# Patient Record
Sex: Female | Born: 1980 | Race: Black or African American | Hispanic: No | Marital: Single | Smoking: Former smoker
Health system: Southern US, Community
[De-identification: ages and names within clinical notes are randomized; demographics above are authoritative.]

## PROBLEM LIST (undated history)

## (undated) DIAGNOSIS — R0600 Dyspnea, unspecified: Secondary | ICD-10-CM

## (undated) DIAGNOSIS — O2230 Deep phlebothrombosis in pregnancy, unspecified trimester: Secondary | ICD-10-CM

## (undated) DIAGNOSIS — J342 Deviated nasal septum: Secondary | ICD-10-CM

## (undated) DIAGNOSIS — E282 Polycystic ovarian syndrome: Secondary | ICD-10-CM

## (undated) DIAGNOSIS — I2699 Other pulmonary embolism without acute cor pulmonale: Secondary | ICD-10-CM

## (undated) DIAGNOSIS — G4730 Sleep apnea, unspecified: Secondary | ICD-10-CM

## (undated) DIAGNOSIS — I10 Essential (primary) hypertension: Secondary | ICD-10-CM

## (undated) DIAGNOSIS — I701 Atherosclerosis of renal artery: Secondary | ICD-10-CM

## (undated) DIAGNOSIS — M109 Gout, unspecified: Secondary | ICD-10-CM

## (undated) HISTORY — DX: Polycystic ovarian syndrome: E28.2

## (undated) HISTORY — PX: WISDOM TOOTH EXTRACTION: SHX21

## (undated) HISTORY — DX: Sleep apnea, unspecified: G47.30

## (undated) HISTORY — DX: Essential (primary) hypertension: I10

## (undated) HISTORY — DX: Deviated nasal septum: J34.2

## (undated) HISTORY — DX: Gout, unspecified: M10.9

## (undated) HISTORY — PX: APPENDECTOMY: SHX54

---

## 2018-08-31 DIAGNOSIS — I517 Cardiomegaly: Secondary | ICD-10-CM

## 2019-05-03 DIAGNOSIS — E282 Polycystic ovarian syndrome: Secondary | ICD-10-CM | POA: Insufficient documentation

## 2019-05-07 ENCOUNTER — Ambulatory Visit: Payer: BC Managed Care – PPO | Admitting: Allergy and Immunology

## 2019-05-08 ENCOUNTER — Ambulatory Visit: Payer: BC Managed Care – PPO | Admitting: Allergy

## 2019-05-09 ENCOUNTER — Ambulatory Visit: Payer: BC Managed Care – PPO | Admitting: Allergy and Immunology

## 2019-05-10 ENCOUNTER — Ambulatory Visit (INDEPENDENT_AMBULATORY_CARE_PROVIDER_SITE_OTHER): Payer: BC Managed Care – PPO | Admitting: Allergy and Immunology

## 2019-05-10 ENCOUNTER — Encounter: Payer: Self-pay | Admitting: Allergy and Immunology

## 2019-05-10 ENCOUNTER — Encounter (INDEPENDENT_AMBULATORY_CARE_PROVIDER_SITE_OTHER): Payer: Self-pay

## 2019-05-10 ENCOUNTER — Other Ambulatory Visit: Payer: Self-pay

## 2019-05-10 VITALS — BP 170/90 | HR 72 | Temp 97.2°F | Resp 16 | Ht 65.0 in | Wt 379.0 lb

## 2019-05-10 DIAGNOSIS — K219 Gastro-esophageal reflux disease without esophagitis: Secondary | ICD-10-CM

## 2019-05-10 DIAGNOSIS — G43909 Migraine, unspecified, not intractable, without status migrainosus: Secondary | ICD-10-CM | POA: Diagnosis not present

## 2019-05-10 DIAGNOSIS — J3089 Other allergic rhinitis: Secondary | ICD-10-CM | POA: Diagnosis not present

## 2019-05-10 MED ORDER — BUDESONIDE 32 MCG/ACT NA SUSP: NASAL | 5 refills | Status: DC

## 2019-05-10 MED ORDER — OMEPRAZOLE 40 MG PO CPDR: 40.0000 mg | DELAYED_RELEASE_CAPSULE | ORAL | 5 refills | Status: DC

## 2019-05-10 MED ORDER — CYPROHEPTADINE HCL 4 MG PO TABS: ORAL_TABLET | ORAL | 5 refills | Status: DC

## 2019-05-10 MED ORDER — FAMOTIDINE 40 MG PO TABS: 40.0000 mg | ORAL_TABLET | Freq: Every day | ORAL | 5 refills | Status: DC

## 2019-05-10 NOTE — Patient Instructions (Addendum)
  1.  Allergen avoidance measures?  2.  Treat and prevent inflammation:   A. OTC Budesonide - 1 spray each nostril 1 time per day  3.  Treat and prevent reflux:   A. Omeprazole 40 mg - 1 tablet 1 time per day in AM  B. Famotidine 40 mg - 1 tablet 1 time per day in PM  4. Treat and prevent headaches:   A. Periactin - 1/2 to 1 tablet at bedtime  B. No caffeine or chocolate consumption  5. If needed:   A. Nasal saline  B. OTC antihistamine  6. Obtain COVID vaccine  7. Return to clinic in 4 weeks or earlier if problem

## 2019-05-10 NOTE — Progress Notes (Signed)
[place] [facility] [place] - [place]   Dear FNP Kerby Nora,  Thank you for referring Joan Beard to the [facility] [place] on 05/10/2019.   Below is a summation of this patient's evaluation and recommendations.  Thank you for your referral. I will keep you informed about this patient's response to treatment.   If you have any questions please do not hesitate to contact me.   Sincerely,  Jessica Priest, MD Allergy / Immunology [facility]   ______________________________________________________________________    NEW PATIENT NOTE  Referring Provider: Vivien Rota* Primary Provider: Garrel Ridgel, FNP Date of office visit: 05/10/2019    Subjective:   Chief Complaint:  Joan Beard (DOB: June 15, 1980) is a 39 y.o. female who presents to the clinic on 05/10/2019 with a chief complaint of Sinus Problem .     HPI: Joan Beard presents to this clinic in evaluation of persistent respiratory tract symptoms and headaches.  She apparently has a long history of having problems with sneezing and occasional nasal congestion.  This appears to occur on a perennial basis without any obvious provoking factor except for possible exposure to various fumes and strong smells.  She does have some decreased ability to smell very well.  She has apparently been skin tested by a pulmonologist who could not identify any specific allergen giving rise to this problem.  Apparently she also had blood test performed for allergies several years ago and this did not identify any significant trigger for her problem.  Apparently she takes over-the-counter "TheraFlu" every night for this issue.  She moved to [place] from [place] in 2013 and she thinks that issue is much worse since that point in time.  There is also a very strong headache history.  This headache is a "sinus pressure headache" which  is a dull thumping in her head and sometimes she can feel a heartbeat behind her eyes.  There does not appear to be any associated scotoma or dizziness but she sometimes does have some blurred vision.  This appears to occur about twice a week and can last all day.  She will take an Excedrin on occasion.  If she gets a very bad headache she will need to lay down and take a Motrin which fortunately is less than 1 time per month.  Her sleep is going pretty well now that she is using a CPAP machine since September 2020.  However, her CPAP machine did not help her headache intensity or frequency.  She does not really consume any caffeine although occasionally has some chocolate.  She also has a issue with constant glob of snot and spot and tickle stuck in her throat along with throat clearing and raspy voice.  Apparently she did see Dr. Marcheta Grammes, ENT, in 2019 and had a rhinoscopy performed which might of identified a polyp in her throat.  She does have reflux disease with regurgitation.  On occasion she will develop nasal regurgitation at nighttime.  She will take a Pepcid every night.  If she has one of her nasal regurgitation episode she will use some baking soda to get her back to sleep.  Past Medical History:  Diagnosis Date  . Deviated septum   . Gout   . High blood pressure   . PCOS (polycystic ovarian syndrome)   . Sleep apnea     Past Surgical History:  Procedure Laterality Date  .  WISDOM TOOTH EXTRACTION      Allergies as of 05/10/2019   Not on File     Medication List      allopurinol 100 MG tablet Commonly known as: ZYLOPRIM Take 100 mg by mouth daily.   diltiazem 240 MG 24 hr capsule Commonly known as: CARDIZEM CD Take 240 mg by mouth daily.   DULoxetine 30 MG capsule Commonly known as: CYMBALTA Take 30 mg by mouth daily.   famotidine 20 MG tablet Commonly known as: PEPCID Take 20 mg by mouth daily.   fexofenadine 180 MG tablet Commonly known as: ALLEGRA Take 180 mg by  mouth daily.   metoprolol succinate 100 MG 24 hr tablet Commonly known as: TOPROL-XL Take 100 mg by mouth daily.   spironolactone 25 MG tablet Commonly known as: ALDACTONE Take 25 mg by mouth daily.   traMADol 50 MG tablet Commonly known as: ULTRAM Take 50 mg by mouth every 4 (four) hours as needed.   traZODone 150 MG tablet Commonly known as: DESYREL Take 150 mg by mouth at bedtime as needed.       Review of systems negative except as noted in HPI / PMHx or noted below:  Review of Systems  Constitutional: Negative.   HENT: Negative.   Eyes: Negative.   Respiratory: Negative.   Cardiovascular: Negative.   Gastrointestinal: Negative.   Genitourinary: Negative.   Musculoskeletal: Negative.   Skin: Negative.   Neurological: Negative.   Endo/Heme/Allergies: Negative.   Psychiatric/Behavioral: Negative.     Family History  Problem Relation Age of Onset  . Stroke Father   . Diabetes Maternal Grandmother   . High blood pressure Maternal Grandmother   . High blood pressure Paternal Grandmother   . Alzheimer's disease Paternal Grandmother     Social History   Socioeconomic History  . Marital status: Single    Spouse name: Not on file  . Number of children: Not on file  . Years of education: Not on file  . Highest education level: Not on file  Occupational History  . Not on file  Tobacco Use  . Smoking status: Former Games developer  . Smokeless tobacco: Never Used  . Tobacco comment: smoked for less than one year in her teenage years  Substance and Sexual Activity  . Alcohol use: Yes  . Drug use: Never  . Sexual activity: Not on file  Other Topics Concern  . Not on file  Social History Narrative  . Not on file   Social Determinants of Health   Financial Resource Strain:   . Difficulty of Paying Living Expenses: Not on file  Food Insecurity:   . Worried About Programme researcher, broadcasting/film/video in the Last Year: Not on file  . Ran Out of Food in the Last Year: Not on file    Transportation Needs:   . Lack of Transportation (Medical): Not on file  . Lack of Transportation (Non-Medical): Not on file  Physical Activity:   . Days of Exercise per Week: Not on file  . Minutes of Exercise per Session: Not on file  Stress:   . Feeling of Stress : Not on file  Social Connections:   . Frequency of Communication with Friends and Family: Not on file  . Frequency of Social Gatherings with Friends and Family: Not on file  . Attends Religious Services: Not on file  . Active Member of Clubs or Organizations: Not on file  . Attends Banker Meetings: Not on file  . Marital  Status: Not on file  Intimate Partner Violence:   . Fear of Current or Ex-Partner: Not on file  . Emotionally Abused: Not on file  . Physically Abused: Not on file  . Sexually Abused: Not on file    Environmental and Social history  Lives in a house with a dry environment, no animals located inside the household, no carpet in the bedroom, no plastic on the bed, no plastic on the pillow, no smoking ongoing with inside the household.  She works in a school setting.  Objective:   Vitals:   05/10/19 1410  BP: (!) 170/90  Pulse: 72  Resp: 16  Temp: (!) 97.2 F (36.2 C)  SpO2: 97%   Height: 5\' 5"  (165.1 cm) Weight: (!) 379 lb (171.9 kg)  Physical Exam Constitutional:      Appearance: She is not diaphoretic.  HENT:     Head: Normocephalic.     Right Ear: Tympanic membrane, ear canal and external ear normal.     Left Ear: Tympanic membrane, ear canal and external ear normal.     Nose: Nose normal. No mucosal edema or rhinorrhea.     Mouth/Throat:     Pharynx: Uvula midline. No oropharyngeal exudate.  Eyes:     Conjunctiva/sclera: Conjunctivae normal.  Neck:     Thyroid: No thyromegaly.     Trachea: Trachea normal. No tracheal tenderness or tracheal deviation.  Cardiovascular:     Rate and Rhythm: Normal rate and regular rhythm.     Heart sounds: Normal heart sounds, S1  normal and S2 normal. No murmur.  Pulmonary:     Effort: No respiratory distress.     Breath sounds: Normal breath sounds. No stridor. No wheezing or rales.  Lymphadenopathy:     Head:     Right side of head: No tonsillar adenopathy.     Left side of head: No tonsillar adenopathy.     Cervical: No cervical adenopathy.  Skin:    Findings: No erythema or rash.     Nails: There is no clubbing.  Neurological:     Mental Status: She is alert.     Diagnostics: Allergy skin tests were performed.  She did not demonstrate any hypersensitivity against a screening panel of aeroallergens.  Spirometry was performed and demonstrated an FEV1 of 2.65 @ 98 % of predicted. FEV1/FVC = 0.97  Assessment and Plan:    1. Perennial allergic rhinitis   2. Migraine syndrome   3. LPRD (laryngopharyngeal reflux disease)     1.  Allergen avoidance measures?  2.  Treat and prevent inflammation:   A. OTC Budesonide - 1 spray each nostril 1 time per day  3.  Treat and prevent reflux:   A. Omeprazole 40 mg - 1 tablet 1 time per day in AM  B. Famotidine 40 mg - 1 tablet 1 time per day in PM  4. Treat and prevent headaches:   A. Periactin - 1/2 to 1 tablet at bedtime  B. No caffeine or chocolate consumption  5. If needed:   A. Nasal saline  B. OTC antihistamine  6. Obtain COVID vaccine  7. Return to clinic in 4 weeks or earlier if problem  Lashante has an inflamed and irritated respiratory track and I suspect that one of her major insults is reflux and we will treat her aggressively for this issue as noted above.  She will also start an anti-inflammatory agent for her upper airway as noted above.  She has chronic headaches  and we will start her on Periactin at nighttime to see if we can modify the expression of these headaches.  I will regroup with her in 4 weeks to consider further evaluation and treatment based upon her response to this approach.  Jessica Priest, MD Allergy / Immunology [facility] [place]

## 2019-05-14 ENCOUNTER — Encounter: Payer: Self-pay | Admitting: Allergy and Immunology

## 2019-05-17 ENCOUNTER — Encounter: Payer: Self-pay | Admitting: Allergy and Immunology

## 2019-06-07 ENCOUNTER — Ambulatory Visit: Payer: BC Managed Care – PPO | Admitting: Allergy and Immunology

## 2019-06-12 ENCOUNTER — Ambulatory Visit: Payer: BC Managed Care – PPO | Admitting: Allergy

## 2019-06-19 ENCOUNTER — Ambulatory Visit: Payer: BC Managed Care – PPO | Admitting: Allergy

## 2019-07-10 ENCOUNTER — Ambulatory Visit: Payer: BC Managed Care – PPO | Admitting: Allergy

## 2019-07-24 ENCOUNTER — Encounter: Payer: Self-pay | Admitting: Allergy

## 2019-07-24 ENCOUNTER — Ambulatory Visit (INDEPENDENT_AMBULATORY_CARE_PROVIDER_SITE_OTHER): Payer: BC Managed Care – PPO | Admitting: Allergy

## 2019-07-24 ENCOUNTER — Other Ambulatory Visit: Payer: Self-pay

## 2019-07-24 VITALS — BP 148/72 | HR 95 | Temp 97.9°F | Resp 18

## 2019-07-24 DIAGNOSIS — K219 Gastro-esophageal reflux disease without esophagitis: Secondary | ICD-10-CM | POA: Diagnosis not present

## 2019-07-24 DIAGNOSIS — J310 Chronic rhinitis: Secondary | ICD-10-CM | POA: Diagnosis not present

## 2019-07-24 DIAGNOSIS — G43909 Migraine, unspecified, not intractable, without status migrainosus: Secondary | ICD-10-CM

## 2019-07-24 MED ORDER — AZELASTINE-FLUTICASONE 137-50 MCG/ACT NA SUSP: NASAL | 5 refills | Status: DC

## 2019-07-24 MED ORDER — FAMOTIDINE 40 MG PO TABS: 40.0000 mg | ORAL_TABLET | Freq: Every day | ORAL | 5 refills | Status: DC

## 2019-07-24 MED ORDER — CYPROHEPTADINE HCL 4 MG PO TABS: ORAL_TABLET | ORAL | 5 refills | Status: DC

## 2019-07-24 MED ORDER — MONTELUKAST SODIUM 10 MG PO TABS: 10.0000 mg | ORAL_TABLET | Freq: Every day | ORAL | 5 refills | Status: DC

## 2019-07-24 NOTE — Progress Notes (Signed)
Follow-up Note  RE: Ambry Dix MRN: [id number] DOB: 04/02/1980 Date of Office Visit: 07/24/2019   History of present illness: Joan Beard is a 39 y.o. female presenting today for follow-up of rhinitis, migraine syndrome and LPR D.  She was last seen in the office on 05/10/2019 by Dr. Lucie Leather.  She states since that visit but she has continued to have issues with allergy type symptoms.  She did have negative skin testing at this visit.  It was recommended that she try over-the-counter budesonide however she states she did not try this.  She does report having both issues of runny and stuffy nose.  She is currently not using any antihistamines at this time.  Previous use of antihistamines did not provide much relief.  She has never tried Singulair before. With her migraines she states the Periactin does help when she has a migraine.  She takes it as needed. She does report that the Prilosec made her stomach hurt all the time regardless of food.  She stopped taking that.  He does continue on Pepcid 40 mg once a day as she states does help.  She also has been trying to identify triggering foods and does not eat acidic foods is the main trigger that she tries to avoid these.  She also sleeps on elevated to decrease the retroflow during sleep.  Review of systems: Review of Systems  Constitutional: Negative.   HENT:       See HPI  Eyes: Negative.   Respiratory: Negative.   Cardiovascular: Negative.   Gastrointestinal: Negative.   Musculoskeletal: Negative.   Skin: Negative.   Neurological: Negative.     All other systems negative unless noted above in HPI  Past medical/social/surgical/family history have been reviewed and are unchanged unless specifically indicated below.  No changes  Medication List: Current Outpatient Medications  Medication Sig Dispense Refill  . allopurinol (ZYLOPRIM) 100 MG tablet Take 100 mg by mouth daily.    . cyproheptadine (PERIACTIN) 4 MG tablet Take  one-half to one tablet by mouth at bedtime as directed. 30 tablet 5  . DILTIAZEM HCL PO Take by mouth.    . famotidine (PEPCID) 40 MG tablet Take 1 tablet (40 mg total) by mouth at bedtime. 30 tablet 5  . fexofenadine (ALLEGRA) 180 MG tablet Take 180 mg by mouth daily.    . metoprolol succinate (TOPROL-XL) 100 MG 24 hr tablet Take 100 mg by mouth daily.    Marland Kitchen omeprazole (PRILOSEC) 40 MG capsule Take 1 capsule (40 mg total) by mouth every morning. 30 capsule 5  . spironolactone (ALDACTONE) 25 MG tablet Take 25 mg by mouth daily.     No current facility-administered medications for this visit.     Known medication allergies: Allergies  Allergen Reactions  . Metronidazole Hives     Physical examination: Blood pressure (!) 148/72, pulse 95, temperature 97.9 F (36.6 C), temperature source Temporal, resp. rate 18, SpO2 96 %.  General: Alert, interactive, in no acute distress, obese. HEENT: PERRLA, TMs pearly gray, turbinates moderately edematous L>R without discharge, post-pharynx non erythematous. Neck: Supple without lymphadenopathy. Lungs: Clear to auscultation without wheezing, rhonchi or rales. {no increased work of breathing. CV: Normal S1, S2 without murmurs. Abdomen: Nondistended, nontender. Skin: Warm and dry, without lesions or rashes. Extremities:  No clubbing, cyanosis or edema. Neuro:   Grossly intact.  Diagnositics/Labs: None today  Assessment and plan: Rhinitis, nonallergic -she has not demonstrate any sensitivity on testing.  She does continue to  report symptoms particularly nasal symptoms thus we will have her try Dymista.  She is also not try Singulair and may get some benefit from Singulair whereas she has not had any benefit with oral antihistamine use. Migraine syndrome -continue Periactin as below for control of migraine symptoms LPRD (laryngopharyngeal reflux disease) -continue triggering foods, lifestyle modifications as well as Pepcid for maintenance  1.   Treat and prevent inflammation:   A. For nasal congestion and drainage control recommend use of Dymista.  Dymista 1 spray each nostril twice a day.  This is a combination nasal spray with Flonase + Astelin (nasal antihistamine).  This helps with both nasal congestion and drainage.   B. Trial Singulair 10mg  daily at bedtime.  If you notice any change in mood/behavior/sleep after starting Singulair then stop this medication and let us know.  Symptoms resolve after stopping the medication.    2.  Treat and prevent reflux:   A. Famotidine 40 mg - 1 tablet 1 time per day in PM  B. Continue avoidance of trigger foods (ie. Acidic foods)   3. Treat and prevent headaches:   A. Periactin - 1/2 to 1 tablet at bedtime   B. No caffeine or chocolate consumption  4. If needed:   A. Nasal saline spray  B. OTC antihistamine  5. Recommend obtaining COVID vaccine if not done so already  6. Return to clinic in 3-4 months or earlier if problem  I appreciate the opportunity to take part in [facility] care. Please do not hesitate to contact me with questions.  Sincerely,   Prudy Feeler, MD Allergy/Immunology [facility] [place] [facility] [place]

## 2019-07-24 NOTE — Patient Instructions (Addendum)
  1.  Treat and prevent inflammation:   A. For nasal congestion and drainage control recommend use of Dymista.  Dymista 1 spray each nostril twice a day.  This is a combination nasal spray with Flonase + Astelin (nasal antihistamine).  This helps with both nasal congestion and drainage.   B. Trial Singulair 10mg  daily at bedtime.  If you notice any change in mood/behavior/sleep after starting Singulair then stop this medication and let know.  Symptoms resolve after stopping the medication.    2.  Treat and prevent reflux:   A. Famotidine 40 mg - 1 tablet 1 time per day in PM  B. Continue avoidance of trigger foods (ie. Acidic foods)   3. Treat and prevent headaches:   A. Periactin - 1/2 to 1 tablet at bedtime   B. No caffeine or chocolate consumption  4. If needed:   A. Nasal saline spray  B. OTC antihistamine  5. Recommend obtaining COVID vaccine if not done so already  6. Return to clinic in 3-4 months or earlier if problem

## 2019-08-30 DIAGNOSIS — K219 Gastro-esophageal reflux disease without esophagitis: Secondary | ICD-10-CM | POA: Insufficient documentation

## 2019-09-14 ENCOUNTER — Inpatient Hospital Stay (HOSPITAL_COMMUNITY)
Admission: EM | Admit: 2019-09-14 | Discharge: 2019-09-17 | DRG: 176 | Disposition: A | Discharge status: Home / Self Care | Payer: BC Managed Care – PPO | Source: Other Acute Inpatient Hospital | Attending: Infectious Disease | Admitting: Infectious Disease

## 2019-09-14 ENCOUNTER — Encounter (HOSPITAL_COMMUNITY): Payer: Self-pay | Admitting: Internal Medicine

## 2019-09-14 DIAGNOSIS — I2699 Other pulmonary embolism without acute cor pulmonale: Principal | ICD-10-CM | POA: Diagnosis present

## 2019-09-14 DIAGNOSIS — I2510 Atherosclerotic heart disease of native coronary artery without angina pectoris: Secondary | ICD-10-CM | POA: Diagnosis present

## 2019-09-14 DIAGNOSIS — Z820 Family history of epilepsy and other diseases of the nervous system: Secondary | ICD-10-CM | POA: Diagnosis not present

## 2019-09-14 DIAGNOSIS — Z79899 Other long term (current) drug therapy: Secondary | ICD-10-CM | POA: Diagnosis not present

## 2019-09-14 DIAGNOSIS — I701 Atherosclerosis of renal artery: Secondary | ICD-10-CM | POA: Diagnosis present

## 2019-09-14 DIAGNOSIS — Z833 Family history of diabetes mellitus: Secondary | ICD-10-CM

## 2019-09-14 DIAGNOSIS — Z87891 Personal history of nicotine dependence: Secondary | ICD-10-CM | POA: Diagnosis not present

## 2019-09-14 DIAGNOSIS — I2694 Multiple subsegmental pulmonary emboli without acute cor pulmonale: Secondary | ICD-10-CM | POA: Diagnosis not present

## 2019-09-14 DIAGNOSIS — Z823 Family history of stroke: Secondary | ICD-10-CM

## 2019-09-14 DIAGNOSIS — G4733 Obstructive sleep apnea (adult) (pediatric): Secondary | ICD-10-CM | POA: Diagnosis present

## 2019-09-14 DIAGNOSIS — E785 Hyperlipidemia, unspecified: Secondary | ICD-10-CM | POA: Diagnosis present

## 2019-09-14 DIAGNOSIS — I150 Renovascular hypertension: Secondary | ICD-10-CM | POA: Diagnosis not present

## 2019-09-14 DIAGNOSIS — M109 Gout, unspecified: Secondary | ICD-10-CM | POA: Diagnosis present

## 2019-09-14 DIAGNOSIS — J449 Chronic obstructive pulmonary disease, unspecified: Secondary | ICD-10-CM | POA: Diagnosis present

## 2019-09-14 DIAGNOSIS — Z6841 Body Mass Index (BMI) 40.0 and over, adult: Secondary | ICD-10-CM

## 2019-09-14 DIAGNOSIS — I82411 Acute embolism and thrombosis of right femoral vein: Secondary | ICD-10-CM | POA: Diagnosis not present

## 2019-09-14 DIAGNOSIS — I2602 Saddle embolus of pulmonary artery with acute cor pulmonale: Secondary | ICD-10-CM | POA: Diagnosis not present

## 2019-09-14 DIAGNOSIS — I10 Essential (primary) hypertension: Secondary | ICD-10-CM | POA: Diagnosis present

## 2019-09-14 DIAGNOSIS — Z20822 Contact with and (suspected) exposure to covid-19: Secondary | ICD-10-CM | POA: Diagnosis present

## 2019-09-14 DIAGNOSIS — M7989 Other specified soft tissue disorders: Secondary | ICD-10-CM | POA: Diagnosis not present

## 2019-09-14 HISTORY — DX: Dyspnea, unspecified: R06.00

## 2019-09-14 HISTORY — DX: Atherosclerosis of renal artery: I70.1

## 2019-09-14 LAB — SARS CORONAVIRUS 2 BY RT PCR (HOSPITAL ORDER, PERFORMED IN ~~LOC~~ HOSPITAL LAB): SARS Coronavirus 2: NEGATIVE

## 2019-09-15 ENCOUNTER — Inpatient Hospital Stay (HOSPITAL_COMMUNITY): Payer: BC Managed Care – PPO

## 2019-09-15 ENCOUNTER — Other Ambulatory Visit: Payer: Self-pay

## 2019-09-15 DIAGNOSIS — I150 Renovascular hypertension: Secondary | ICD-10-CM

## 2019-09-15 DIAGNOSIS — G4733 Obstructive sleep apnea (adult) (pediatric): Secondary | ICD-10-CM

## 2019-09-15 DIAGNOSIS — Z6841 Body Mass Index (BMI) 40.0 and over, adult: Secondary | ICD-10-CM

## 2019-09-15 DIAGNOSIS — I2699 Other pulmonary embolism without acute cor pulmonale: Secondary | ICD-10-CM | POA: Diagnosis present

## 2019-09-15 DIAGNOSIS — I2694 Multiple subsegmental pulmonary emboli without acute cor pulmonale: Secondary | ICD-10-CM

## 2019-09-15 LAB — BASIC METABOLIC PANEL
Anion gap: 13 (ref 5–15)
BUN: 13 mg/dL (ref 6–20)
CO2: 22 mmol/L (ref 22–32)
Calcium: 8.9 mg/dL (ref 8.9–10.3)
Chloride: 105 mmol/L (ref 98–111)
Creatinine, Ser: 1.31 mg/dL — ABNORMAL HIGH (ref 0.44–1.00)
GFR calc Af Amer: >60 mL/min (ref >60)
GFR calc non Af Amer: 52 mL/min — ABNORMAL LOW (ref >60)
Glucose, Bld: 171 mg/dL — ABNORMAL HIGH (ref 70–99)
Potassium: 4.2 mmol/L (ref 3.5–5.1)
Sodium: 140 mmol/L (ref 135–145)

## 2019-09-15 LAB — CBC
HCT: 43.3 % (ref 36.0–46.0)
HCT: 42.4 % (ref 36.0–46.0)
Hemoglobin: 13.7 g/dL (ref 12.0–15.0)
Hemoglobin: 13.6 g/dL (ref 12.0–15.0)
MCH: 30.9 pg (ref 26.0–34.0)
MCH: 31.1 pg (ref 26.0–34.0)
MCHC: 31.6 g/dL (ref 30.0–36.0)
MCHC: 32.1 g/dL (ref 30.0–36.0)
MCV: 97.7 fL (ref 80.0–100.0)
MCV: 97 fL (ref 80.0–100.0)
Platelets: 258 10*3/uL (ref 150–400)
Platelets: 252 10*3/uL (ref 150–400)
RBC: 4.43 MIL/uL (ref 3.87–5.11)
RBC: 4.37 MIL/uL (ref 3.87–5.11)
RDW: 13.8 % (ref 11.5–15.5)
RDW: 13.6 % (ref 11.5–15.5)
WBC: 8 10*3/uL (ref 4.0–10.5)
WBC: 11.2 10*3/uL — ABNORMAL HIGH (ref 4.0–10.5)
nRBC: 0 % (ref 0.0–0.2)
nRBC: 0 % (ref 0.0–0.2)

## 2019-09-15 LAB — HEPARIN LEVEL (UNFRACTIONATED)
Heparin Unfractionated: 0.2 IU/mL — ABNORMAL LOW (ref 0.30–0.70)
Heparin Unfractionated: 0.42 IU/mL (ref 0.30–0.70)

## 2019-09-15 LAB — PROTIME-INR
INR: 1.3 — ABNORMAL HIGH (ref 0.8–1.2)
INR: 2.1 — ABNORMAL HIGH (ref 0.8–1.2)
Prothrombin Time: 15.3 seconds — ABNORMAL HIGH (ref 11.4–15.2)
Prothrombin Time: 22.6 seconds — ABNORMAL HIGH (ref 11.4–15.2)

## 2019-09-15 LAB — APTT
aPTT: 99 seconds — ABNORMAL HIGH (ref 24–36)
aPTT: 48 seconds — ABNORMAL HIGH (ref 24–36)

## 2019-09-15 LAB — HIV ANTIBODY (ROUTINE TESTING W REFLEX): HIV Screen 4th Generation wRfx: NONREACTIVE

## 2019-09-15 LAB — MRSA PCR SCREENING: MRSA by PCR: NEGATIVE

## 2019-09-15 MED ORDER — HYDROMORPHONE HCL 1 MG/ML IJ SOLN
1.0000 mg | INTRAMUSCULAR | Status: DC | PRN
  Administered 2019-09-15: 1 mg via INTRAVENOUS
  Filled 2019-09-15: qty 1

## 2019-09-15 MED ORDER — METOPROLOL SUCCINATE ER 100 MG PO TB24: 100.0000 mg | ORAL_TABLET | Freq: Every day | ORAL | Status: DC

## 2019-09-15 MED ORDER — ONDANSETRON HCL 4 MG/2ML IJ SOLN
4.0000 mg | Freq: Four times a day (QID) | INTRAMUSCULAR | Status: DC | PRN
  Administered 2019-09-15: 4 mg via INTRAVENOUS
  Filled 2019-09-15: qty 2

## 2019-09-15 MED ORDER — SODIUM CHLORIDE 0.9 % IV SOLN: 250.0000 mL | INTRAVENOUS | Status: DC | PRN

## 2019-09-15 MED ORDER — HEPARIN (PORCINE) 25000 UT/250ML-% IV SOLN
2100.0000 [IU]/h | INTRAVENOUS | Status: DC
  Administered 2019-09-15 – 2019-09-17 (×4): 2100 [IU]/h via INTRAVENOUS
  Filled 2019-09-15 (×3): qty 250

## 2019-09-15 MED ORDER — SODIUM CHLORIDE 0.9 % IV SOLN
250.0000 mL | Freq: Once | INTRAVENOUS | Status: AC
  Administered 2019-09-15: 250 mL via INTRAVENOUS

## 2019-09-15 MED ORDER — SODIUM CHLORIDE 0.9% FLUSH: 3.0000 mL | INTRAVENOUS | Status: DC | PRN

## 2019-09-15 MED ORDER — PANTOPRAZOLE SODIUM 40 MG PO TBEC
40.0000 mg | DELAYED_RELEASE_TABLET | Freq: Every day | ORAL | Status: DC
  Administered 2019-09-15 – 2019-09-17 (×3): 40 mg via ORAL
  Filled 2019-09-15 (×3): qty 1

## 2019-09-15 MED ORDER — ACETAMINOPHEN 325 MG PO TABS: 650.0000 mg | ORAL_TABLET | Freq: Four times a day (QID) | ORAL | Status: DC | PRN

## 2019-09-15 MED ORDER — KETOROLAC TROMETHAMINE 30 MG/ML IJ SOLN
30.0000 mg | Freq: Four times a day (QID) | INTRAMUSCULAR | Status: DC | PRN
  Administered 2019-09-15: 30 mg via INTRAVENOUS
  Filled 2019-09-15: qty 1

## 2019-09-15 MED ORDER — CYPROHEPTADINE HCL 4 MG PO TABS
4.0000 mg | ORAL_TABLET | Freq: Every day | ORAL | Status: DC
  Administered 2019-09-15 – 2019-09-16 (×3): 4 mg via ORAL
  Filled 2019-09-15 (×5): qty 1

## 2019-09-15 MED ORDER — LORATADINE 10 MG PO TABS
10.0000 mg | ORAL_TABLET | Freq: Every day | ORAL | Status: DC
  Administered 2019-09-15 – 2019-09-17 (×3): 10 mg via ORAL
  Filled 2019-09-15 (×3): qty 1

## 2019-09-15 MED ORDER — FLUTICASONE PROPIONATE 50 MCG/ACT NA SUSP
1.0000 | Freq: Every day | NASAL | Status: DC
  Administered 2019-09-15 – 2019-09-17 (×3): 1 via NASAL
  Filled 2019-09-15: qty 16

## 2019-09-15 MED ORDER — HEPARIN BOLUS VIA INFUSION
2000.0000 [IU] | Freq: Once | INTRAVENOUS | Status: AC
  Administered 2019-09-15: 2000 [IU] via INTRAVENOUS
  Filled 2019-09-15: qty 2000

## 2019-09-15 MED ORDER — AZELASTINE HCL 0.1 % NA SOLN
1.0000 | Freq: Every day | NASAL | Status: DC
  Filled 2019-09-15: qty 30

## 2019-09-15 MED ORDER — ALTEPLASE (PULMONARY EMBOLISM) INFUSION
100.0000 mg | Freq: Once | INTRAVENOUS | Status: AC
  Administered 2019-09-15: 100 mg via INTRAVENOUS
  Filled 2019-09-15: qty 100

## 2019-09-15 MED ORDER — DILTIAZEM HCL 60 MG PO TABS
60.0000 mg | ORAL_TABLET | Freq: Three times a day (TID) | ORAL | Status: DC
  Filled 2019-09-15: qty 1

## 2019-09-15 MED ORDER — ACETAMINOPHEN 650 MG RE SUPP: 650.0000 mg | Freq: Four times a day (QID) | RECTAL | Status: DC | PRN

## 2019-09-15 MED ORDER — ALLOPURINOL 100 MG PO TABS
100.0000 mg | ORAL_TABLET | Freq: Every day | ORAL | Status: DC
  Administered 2019-09-15 – 2019-09-17 (×3): 100 mg via ORAL
  Filled 2019-09-15 (×3): qty 1

## 2019-09-15 MED ORDER — TRAZODONE HCL 50 MG PO TABS
25.0000 mg | ORAL_TABLET | Freq: Every day | ORAL | Status: DC
  Administered 2019-09-15 – 2019-09-16 (×3): 25 mg via ORAL
  Filled 2019-09-15 (×3): qty 1

## 2019-09-15 MED ORDER — LABETALOL HCL 200 MG PO TABS
200.0000 mg | ORAL_TABLET | Freq: Two times a day (BID) | ORAL | Status: DC
  Administered 2019-09-15 (×3): 200 mg via ORAL
  Filled 2019-09-15 (×3): qty 1

## 2019-09-15 MED ORDER — SODIUM CHLORIDE 0.9% FLUSH
3.0000 mL | Freq: Two times a day (BID) | INTRAVENOUS | Status: DC
  Administered 2019-09-15 – 2019-09-17 (×5): 3 mL via INTRAVENOUS

## 2019-09-15 MED ORDER — SPIRONOLACTONE 25 MG PO TABS
25.0000 mg | ORAL_TABLET | Freq: Every day | ORAL | Status: DC
  Administered 2019-09-15 – 2019-09-17 (×3): 25 mg via ORAL
  Filled 2019-09-15 (×3): qty 1

## 2019-09-15 MED ORDER — OXYCODONE HCL 5 MG PO TABS
5.0000 mg | ORAL_TABLET | Freq: Four times a day (QID) | ORAL | Status: DC | PRN
  Administered 2019-09-15 – 2019-09-16 (×3): 5 mg via ORAL
  Filled 2019-09-15 (×3): qty 1

## 2019-09-15 MED ORDER — AZELASTINE-FLUTICASONE 137-50 MCG/ACT NA SUSP: 137.0000 | Freq: Every morning | NASAL | Status: DC

## 2019-09-15 MED ORDER — HEPARIN (PORCINE) 25000 UT/250ML-% IV SOLN
2100.0000 [IU]/h | INTRAVENOUS | Status: DC
  Administered 2019-09-15: 1800 [IU]/h via INTRAVENOUS
  Filled 2019-09-15 (×2): qty 250

## 2019-09-15 MED ORDER — GUAIFENESIN 100 MG/5ML PO SOLN
5.0000 mL | ORAL | Status: DC | PRN
  Filled 2019-09-15: qty 5

## 2019-09-15 MED ORDER — CHLORHEXIDINE GLUCONATE CLOTH 2 % EX PADS
6.0000 | MEDICATED_PAD | Freq: Every day | CUTANEOUS | Status: DC
  Administered 2019-09-15 – 2019-09-17 (×3): 6 via TOPICAL

## 2019-09-15 MED ORDER — NITROGLYCERIN 0.4 MG SL SUBL
SUBLINGUAL_TABLET | SUBLINGUAL | Status: AC
  Filled 2019-09-15: qty 1

## 2019-09-15 MED ORDER — MONTELUKAST SODIUM 10 MG PO TABS
10.0000 mg | ORAL_TABLET | Freq: Every day | ORAL | Status: DC
  Administered 2019-09-15 – 2019-09-16 (×3): 10 mg via ORAL
  Filled 2019-09-15 (×3): qty 1

## 2019-09-15 NOTE — Progress Notes (Signed)
Patient complaining of sudden onset of nausea and vomited large amt of semi-digested food x 3.  Followed by profuse diaphoresis and worsening SOB.  Primary nurse notified.  Emotional and physical support provided to patient.  CCM to be notified.

## 2019-09-15 NOTE — Progress Notes (Addendum)
ANTICOAGULATION CONSULT NOTE - Initial Consult  Pharmacy Consult for Heparin Indication: pulmonary embolus  Allergies  Allergen Reactions  . Metronidazole Hives    Patient Measurements: Height: 5' 3.5" (161.3 cm) Weight: (!) 168.1 kg (370 lb 9.6 oz) (scale B) IBW/kg (Calculated) : 53.55 Heparin Dosing Weight: 97 kg  Vital Signs: Temp: 98.5 F (36.9 C) (06/25 2130) Temp Source: Oral (06/25 2130) BP: 142/95 (06/25 2130) Pulse Rate: 91 (06/25 2130)  Labs: No results for input(s): HGB, HCT, PLT, APTT, LABPROT, INR, HEPARINUNFRC, HEPRLOWMOCWT, CREATININE, CKTOTAL, CKMB, TROPONINIHS in the last 72 hours.  CrCl cannot be calculated (No successful lab value found.).   Medical History: Past Medical History:  Diagnosis Date  . Deviated septum   . Dyspnea   . Gout   . High blood pressure   . PCOS (polycystic ovarian syndrome)   . Renal artery stenosis (HCC)   . Sleep apnea     Medications:  See electronic med rec  Assessment: 39 y.o. F presented to [facility] and found to have submassive PE. Heparin bolus 5000 units and gtt at 1800 units/hr started ~1540. To continue heparin upon transfer. No AC PTA.  Goal of Therapy:  Heparin level 0.3-0.7 units/ml Monitor platelets by anticoagulation protocol: Yes   Plan:  Continue heparin at 1800 units/hr STAT heparin level Will f/u daily heparin level and CBC  Christoper Fabian, PharmD, BCPS Please see amion for complete clinical pharmacist phone list 09/15/2019,12:19 AM   Addendum: Heparin level 0.2 (subtherapeutic) on gtt at 1800 units/hr. No issues with line or bleeding reported per RN.  Will rebolus heparin 2000 units and increase gtt to 2100 units/hr.  Christoper Fabian, PharmD, BCPS Please see amion for complete clinical pharmacist phone list 09/15/2019 1:43 AM

## 2019-09-15 NOTE — Plan of Care (Signed)
Patient is a 39 year old female with history of morbid obesity, hypertension, renal artery stenosis, obstructive sleep apnea who is a transfer this morning, and reports 51 she presented with progressively worsening shortness of breath and dyspnea on exertion.  Lab work in the ED showed elevated D-dimer 3039.  CTA showed large amount of thrombus in bilateral pulmonary arteries left greater than right with the main.  And lower lobe involvement.  Patient was also reportedly noted to have right heart strain consistent with submassive PE.  She has been started on heparin drip.  This morning she is complaining of chest pain.  Patient seen and examined. She was given Dilaudid but patient started becoming diaphoretic secondary to the pain.  Consulted PCCM.  Plan for TPA per PCCM.  Lower extremity Doppler ultrasound ordered.

## 2019-09-15 NOTE — H&P (Signed)
History and Physical    Joan Beard ZJI:[id number] DOB: 23-Mar-1980 DOA: 09/14/2019  PCP: Durwin Nora, FNP (Confirm with patient/family/NH records and if not entered, this has to be entered at [facility] point of entry) Patient coming from: transfer from [facility]  I have personally briefly reviewed patient's old medical records in [place]  Chief Complaint: progressively worsening SOB/DOE  HPI: Joan Beard is a 39 y.o. female with medical history significant of morbid obesity, HTN, OSA, RAS presented to [facility] ED due to progressive SOB/DOE. She denied chest pain, cough. Eval in the ED included BNP elevated at 1880, D-dimer 3039, Troponin 0.8, Cr 1.10.  CTA reveaed large amount of thrombus in bilateral pulmonary arteries L>R with main arterial and lower lob involvement. Also right heart strain c/w submassive PE.  Heparin qtt started. Patient transferred to [facility] for continue treatment  ED Course: See above - patient seen RH and transferred  Review of Systems: As per HPI otherwise 10 point review of systems negative.    Past Medical History:  Diagnosis Date  . Deviated septum   . Dyspnea   . Gout   . High blood pressure   . PCOS (polycystic ovarian syndrome)   . Renal artery stenosis ([place])   . Sleep apnea     Past Surgical History:  Procedure Laterality Date  . WISDOM TOOTH EXTRACTION       reports that she has quit smoking. She has never used smokeless tobacco. She reports current alcohol use. She reports that she does not use drugs.  Allergies  Allergen Reactions  . Metronidazole Hives    Family History  Problem Relation Age of Onset  . Stroke Father   . Diabetes Maternal Grandmother   . High blood pressure Maternal Grandmother   . High blood pressure Paternal Grandmother   . Alzheimer's disease Paternal Grandmother    Soc Hx - native of [place]. [place]. Single. Works as Cytogeneticist.  Prior to Admission  medications   Medication Sig Start Date End Date Taking? Authorizing Provider  allopurinol (ZYLOPRIM) 100 MG tablet Take 100 mg by mouth daily. 04/04/19   [provider]  Azelastine-Fluticasone 137-50 MCG/ACT SUSP Use 1 spray in each nostril twice daily 07/24/19   Kennith Gain, MD  cyproheptadine (PERIACTIN) 4 MG tablet Take one-half to one tablet by mouth at bedtime as directed. 07/24/19   Kennith Gain, MD  DILTIAZEM HCL PO Take by mouth.    [provider]  famotidine (PEPCID) 40 MG tablet Take 1 tablet (40 mg total) by mouth at bedtime. 07/24/19   Kennith Gain, MD  fexofenadine (ALLEGRA) 180 MG tablet Take 180 mg by mouth daily.    [provider]  metoprolol succinate (TOPROL-XL) 100 MG 24 hr tablet Take 100 mg by mouth daily. 05/09/19   [provider]  montelukast (SINGULAIR) 10 MG tablet Take 1 tablet (10 mg total) by mouth at bedtime. 07/24/19   Kennith Gain, MD  omeprazole (PRILOSEC) 40 MG capsule Take 1 capsule (40 mg total) by mouth every morning. 05/10/19   Kozlow, Donnamarie Poag, MD  spironolactone (ALDACTONE) 25 MG tablet Take 25 mg by mouth daily. 05/09/19   [provider]    Physical Exam: Vitals:   09/14/19 2130 09/14/19 2137  BP: (!) 142/95   Pulse: 91   Resp: 20   Temp: 98.5 F (36.9 C)   TempSrc: Oral   SpO2: 96%   Weight:  Marland Kitchen)  168.1 kg  Height:  5' 3.5" (1.613 m)    Constitutional: NAD, calm, comfortable Vitals:   09/14/19 2130 09/14/19 2137  BP: (!) 142/95   Pulse: 91   Resp: 20   Temp: 98.5 F (36.9 C)   TempSrc: Oral   SpO2: 96%   Weight:  (!) 168.1 kg  Height:  5' 3.5" (1.613 m)   General - pleasant woman in no distress Eyes: PERRL, lids and conjunctivae normal ENMT: Mucous membranes are moist. Posterior pharynx clear of any exudate or lesions.Normal dentition.  Neck: normal, supple, no masses, no thyromegaly Respiratory: clear to auscultation bilaterally, no wheezing,  no crackles. Normal respiratory effort. No accessory muscle use.  Cardiovascular: Regular rate and rhythm, no murmurs / rubs / gallops. No extremity edema. 2+ pedal pulses. No carotid bruits.  Abdomen: obese, no tenderness, no masses palpated.  Bowel sounds positive.  Musculoskeletal: no clubbing / cyanosis. No joint deformity upper and lower extremities. Good ROM, no contractures. Normal muscle tone.  Skin: no rashes, lesions, ulcers. No induration Neurologic: CN 2-12 grossly intact. Sensation intact, DTR normal. Strength 5/5 in all 4.  Psychiatric: Normal judgment and insight. Alert and oriented x 3. Normal mood.     Labs on Admission: I have personally reviewed following labs and imaging studies  CBC: No results for input(s): WBC, NEUTROABS, HGB, HCT, MCV, PLT in the last 168 hours. Basic Metabolic Panel: No results for input(s): NA, K, CL, CO2, GLUCOSE, BUN, CREATININE, CALCIUM, MG, PHOS in the last 168 hours. GFR: CrCl cannot be calculated (No successful lab value found.). Liver Function Tests: No results for input(s): AST, ALT, ALKPHOS, BILITOT, PROT, ALBUMIN in the last 168 hours. No results for input(s): LIPASE, AMYLASE in the last 168 hours. No results for input(s): AMMONIA in the last 168 hours. Coagulation Profile: No results for input(s): INR, PROTIME in the last 168 hours. Cardiac Enzymes: No results for input(s): CKTOTAL, CKMB, CKMBINDEX, TROPONINI in the last 168 hours. BNP (last 3 results) No results for input(s): PROBNP in the last 8760 hours. HbA1C: No results for input(s): HGBA1C in the last 72 hours. CBG: No results for input(s): GLUCAP in the last 168 hours. Lipid Profile: No results for input(s): CHOL, HDL, LDLCALC, TRIG, CHOLHDL, LDLDIRECT in the last 72 hours. Thyroid Function Tests: No results for input(s): TSH, T4TOTAL, FREET4, T3FREE, THYROIDAB in the last 72 hours. Anemia Panel: No results for input(s): VITAMINB12, FOLATE, FERRITIN, TIBC, IRON,  RETICCTPCT in the last 72 hours. Urine analysis: No results found for: COLORURINE, APPEARANCEUR, LABSPEC, PHURINE, GLUCOSEU, HGBUR, BILIRUBINUR, KETONESUR, PROTEINUR, UROBILINOGEN, NITRITE, LEUKOCYTESUR  Radiological Exams on Admission: No results found.  EKG: Independently reviewed. Done at [facility]: NSR, Prolonged QT, no acute changes.  Assessment/Plan Active Problems:   Pulmonary embolism (HCC)   Morbid obesity with BMI of 60.0-69.9, adult (HCC)   HTN (hypertension)   OSA (obstructive sleep apnea)   Renal artery stenosis (HCC)   PE (pulmonary thromboembolism) (HCC)  (please populate well all problems here in Problem List. (For example, if patient is on BP meds at home and you resume or decide to hold them, it is a problem that needs to be her. Same for CAD, COPD, HLD and so on)   1. PE - patient with submassive PE and right heart strain by CTA. In no distress and oxygenating well on [place] oxygen. Had 2 D echo at [facility] but no report available Plan Continue heparin qtt - pharmacy consult placed  2D echo in AM to assess  right heart function  PCCM consult in AM  2. HTN - will continue home regimen  3. OSA- will continue CPAP  4. Morbid obesity - no intervention needed at this time  5. RAS - renal function stable. BP seems controlled.  DVT prophylaxis: full dose heparin  Code Status: full code  Family Communication: did not call contact due to hour  Disposition Plan: home when stable  Consults called: PCCM - please call in AM  Admission status: inpatient tele (inpatient / obs / tele / medical floor / SDU)   Illene Regulus MD [facility] Pager [phone]  If 7PM-7AM, please contact night-coverage [email or web address] Password [email or web address]  09/15/2019, 12:45 AM

## 2019-09-15 NOTE — Progress Notes (Signed)
ANTICOAGULATION CONSULT NOTE   Pharmacy Consult for Heparin Indication: pulmonary embolus  Allergies  Allergen Reactions  . Metronidazole Hives    Patient Measurements: Height: 5' 3.5" (161.3 cm) Weight: (!) 167.7 kg (369 lb 11.2 oz) IBW/kg (Calculated) : 53.55 Heparin Dosing Weight: 97 kg  Vital Signs: Temp: 97.8 F (36.6 C) (06/26 1115) Temp Source: Oral (06/26 1115) BP: 130/98 (06/26 1345) Pulse Rate: 61 (06/26 1445)  Labs: Recent Labs    09/15/19 0033 09/15/19 1228 09/15/19 1402  HGB  --  13.7 13.6  HCT  --  43.3 42.4  PLT  --  258 252  APTT  --   --  99*  LABPROT  --  15.3* 22.6*  INR  --  1.3* 2.1*  HEPARINUNFRC 0.20*  --   --   CREATININE  --  1.31*  --     Estimated Creatinine Clearance: 92.1 mL/min (A) (by C-G formula based on SCr of 1.31 mg/dL (H)).   Medical History: Past Medical History:  Diagnosis Date  . Deviated septum   . Dyspnea   . Gout   . High blood pressure   . PCOS (polycystic ovarian syndrome)   . Renal artery stenosis (HCC)   . Sleep apnea     Medications:  See electronic med rec  Assessment: 39 y.o. F presented to [facility] and found to have submassive PE. Heparin bolus 5000 units and gtt at 1800 units/hr started ~1540. To continue heparin upon transfer. No AC PTA.  APTT s/p TPA is now 99.  Goal of Therapy:  Heparin level 0.3-0.7 units/ml Monitor platelets by anticoagulation protocol: Yes   Plan:  Continue heparin at 1800 units/hr STAT heparin level Will f/u daily heparin level and CBC  Christoper Fabian, PharmD, BCPS Please see amion for complete clinical pharmacist phone list 09/15/2019,3:08 PM   Addendum: Repeat aPTT now. Will restart heparin when aPTT < 80.  Reece Leader, Loura Back, BCPS, [facility] Clinical Pharmacist  09/15/2019 3:09 PM   [facility] pharmacy phone numbers are listed on [email or web address]

## 2019-09-15 NOTE — Progress Notes (Signed)
ANTICOAGULATION CONSULT NOTE - Follow Up Consult  Pharmacy Consult for heparin Indication: pulmonary embolus  Labs: Recent Labs    09/15/19 0033 09/15/19 1228 09/15/19 1402 09/15/19 1632 09/15/19 2323  HGB  --  13.7 13.6  --   --   HCT  --  43.3 42.4  --   --   PLT  --  258 252  --   --   APTT  --   --  99* 48*  --   LABPROT  --  15.3* 22.6*  --   --   INR  --  1.3* 2.1*  --   --   HEPARINUNFRC 0.20*  --   --   --  0.42  CREATININE  --  1.31*  --   --   --     Assessment/Plan:  39yo female therapeutic on heparin after resuming heparin s/p alteplase for PE. Will continue gtt at current rate and confirm stable with additional level.   Vernard Gambles, PharmD, BCPS  09/15/2019,11:55 PM

## 2019-09-15 NOTE — Progress Notes (Signed)
Daughter Clarett notified of patient transfer to 2 heart bed 15,thanked me for calling.

## 2019-09-15 NOTE — Progress Notes (Signed)
   09/15/19 0952  Assess: MEWS Score  Temp 98.5 F (36.9 C)  BP (!) 132/94  Pulse Rate 84  Resp (!) 28  Level of Consciousness Alert  SpO2 96 %  O2 Device Nasal Cannula  O2 Flow Rate (L/min) 2 L/min  Assess: MEWS Score  MEWS Temp 0  MEWS Systolic 0  MEWS Pulse 0  MEWS RR 2  MEWS LOC 0  MEWS Score 2  MEWS Score Color Yellow  Assess: if the MEWS score is Yellow or Red  Were vital signs taken at a resting state? Yes  Focused Assessment Documented focused assessment  Early Detection of Sepsis Score *See Row Information* Low  MEWS guidelines implemented *See Row Information* No, vital signs rechecked  Treat  MEWS Interventions Escalated (See documentation below)  Take Vital Signs  Increase Vital Sign Frequency  Yellow: Q 2hr X 2 then Q 4hr X 2, if remains yellow, continue Q 4hrs  Escalate  MEWS: Escalate Yellow: discuss with charge nurse/RN and consider discussing with provider and RRT  Notify: Charge Nurse/RN  Name of Charge Nurse/RN Notified Barbie RN  Date Charge Nurse/RN Notified 09/15/19  Time Charge Nurse/RN Notified 1000  Notify: Provider  Provider Name/Title Matcha MD  Date Provider Notified 09/15/19  Time Provider Notified 1000  Notification Type Face-to-face  Notification Reason Change in status  Response See new orders  Date of Provider Response 09/15/19  Time of Provider Response 1000  Document  Patient Outcome Transferred/level of care increased

## 2019-09-15 NOTE — Progress Notes (Signed)
Patient being transferred to [facility] bed 15. A/C up assist with transfer. Patient stable on transfer.

## 2019-09-15 NOTE — Consult Note (Signed)
NAMEKaziah Beard, MRN:  [id number], DOB:  04-12-1980, LOS: 1 ADMISSION DATE:  09/14/2019, CONSULTATION DATE:  09/15/19 REFERRING MD:  Barth Kirks, CHIEF COMPLAINT:  SOB   Brief History   39 year old w/ hx OSA, HTN, RAS p/w high risk submassive PE failing conservative therapy.  History of present illness   39 year old w/ hx OSA, HTN, RAS presenting to [place] with progressive SOB over past several days.  CTA showed submassive PE, transferred to [facility] for further management this AM.  This morning, worsening chest pain, SOB, vomiting, light-headedness. PCCM consulted for further recs.  No prior VTE. No history of surgery, stroke.  Never hospitalized before.  Past Medical History  OSA Gout PCOS HTN RAS  Significant Hospital Events   6/26 admitted  Consults:  N/A  Procedures:  N/A  Significant Diagnostic Tests:  CTA chest personally reviewed: suboptimal contrast timing, large clot burden though with significant RV strain. Trop/BNP elevated at OSH  Micro Data:  COVID neg  Antimicrobials:  None   Interim history/subjective:  Consulted  Objective   Blood pressure (!) 127/91, pulse 77, temperature 97.7 F (36.5 C), temperature source Oral, resp. rate (!) 21, height 5' 3.5" (1.613 m), weight (!) 167.7 kg, SpO2 92 %.        Intake/Output Summary (Last 24 hours) at 09/15/2019 1052 Last data filed at 09/15/2019 0700 Gross per 24 hour  Intake 860.09 ml  Output 200 ml  Net 660.09 ml   Filed Weights   09/14/19 2137 09/15/19 0053 09/15/19 0544  Weight: (!) 168.1 kg (!) 167.7 kg (!) 167.7 kg    Examination: General: diaphoretic young woman lying in bed HENT: malampatti 4, MMM Lungs: Clear, no wheezing, tachypneic Cardiovascular: ext warm, not very tachycardic suprisingly Abdomen: Soft, +BS Extremities: difficult to tease out adipose tissue from edema Neuro: GCS15, moves all 4 ext to command Skin: no rashes  Resolved Hospital Problem list   N/A  Assessment & Plan:    High risk submassive PE now with dizziness, nausea, vomiting, chest pain, and worsening SOB- this is a sign of poor forward flow, significant RV strain on OSH CTA.  Would proceed with systemic TPA given lack of contraindicaitons.  Risks and benefits discussed with patient and she is agreeable to proceed. - Transfer to ICU - 100mg  TPA - Monitor neuro status - Resume heparin per usual protocol once PTT falls within range  Best practice:  Diet: heart healthy Pain/Anxiety/Delirium protocol (if indicated): na VAP protocol (if indicated): na DVT prophylaxis: tpa GI prophylaxis: ppi Glucose control: na Mobility: BR Code Status: full Family Communication: will call mother Disposition: ICU  Labs   CBC: No results for input(s): WBC, NEUTROABS, HGB, HCT, MCV, PLT in the last 168 hours.  Basic Metabolic Panel: No results for input(s): NA, K, CL, CO2, GLUCOSE, BUN, CREATININE, CALCIUM, MG, PHOS in the last 168 hours. GFR: CrCl cannot be calculated (No successful lab value found.). No results for input(s): PROCALCITON, WBC, LATICACIDVEN in the last 168 hours.  Liver Function Tests: No results for input(s): AST, ALT, ALKPHOS, BILITOT, PROT, ALBUMIN in the last 168 hours. No results for input(s): LIPASE, AMYLASE in the last 168 hours. No results for input(s): AMMONIA in the last 168 hours.  ABG No results found for: PHART, PCO2ART, PO2ART, HCO3, TCO2, ACIDBASEDEF, O2SAT   Coagulation Profile: No results for input(s): INR, PROTIME in the last 168 hours.  Cardiac Enzymes: No results for input(s): CKTOTAL, CKMB, CKMBINDEX, TROPONINI in the last 168  hours.  HbA1C: No results found for: HGBA1C  CBG: No results for input(s): GLUCAP in the last 168 hours.  Review of Systems:    Positive Symptoms in bold:  Constitutional fevers, chills, weight loss, fatigue, anorexia, malaise  Eyes decreased vision, double vision, eye irritation  Ears, Nose, Mouth, Throat sore throat, trouble  swallowing, sinus congestion  Cardiovascular chest pain, paroxysmal nocturnal dyspnea, lower ext edema, palpitations   Respiratory SOB, cough, DOE, hemoptysis, wheezing  Gastrointestinal nausea, vomiting, diarrhea  Genitourinary burning with urination, trouble urinating  Musculoskeletal joint aches, joint swelling, back pain  Integumentary  rashes, skin lesions  Neurological focal weakness, focal numbness, trouble speaking, headaches  Psychiatric depression, anxiety, confusion  Endocrine polyuria, polydipsia, cold intolerance, heat intolerance  Hematologic abnormal bruising, abnormal bleeding, unexplained nose bleeds  Allergic/Immunologic recurrent infections, hives, swollen lymph nodes     Past Medical History  She,  has a past medical history of Deviated septum, Dyspnea, Gout, High blood pressure, PCOS (polycystic ovarian syndrome), Renal artery stenosis (HCC), and Sleep apnea.   Surgical History    Past Surgical History:  Procedure Laterality Date  . WISDOM TOOTH EXTRACTION       Social History   reports that she has quit smoking. She has never used smokeless tobacco. She reports current alcohol use. She reports that she does not use drugs.   Family History   Her family history includes Alzheimer's disease in her paternal grandmother; Diabetes in her maternal grandmother; High blood pressure in her maternal grandmother and paternal grandmother; Stroke in her father.   Allergies Allergies  Allergen Reactions  . Metronidazole Hives     Home Medications  Prior to Admission medications   Medication Sig Start Date End Date Taking? Authorizing Provider  albuterol (VENTOLIN HFA) 108 (90 Base) MCG/ACT inhaler Inhale 2 puffs into the lungs every 6 (six) hours as needed for wheezing or shortness of breath.   Yes [provider]  allopurinol (ZYLOPRIM) 100 MG tablet Take 100 mg by mouth daily. 04/04/19  Yes [provider]  cyproheptadine (PERIACTIN) 4 MG tablet  Take one-half to one tablet by mouth at bedtime as directed. 07/24/19  Yes Padgett, Pilar Grammes, MD  famotidine (PEPCID) 40 MG tablet Take 1 tablet (40 mg total) by mouth at bedtime. 07/24/19  Yes Padgett, Pilar Grammes, MD  labetalol (NORMODYNE) 200 MG tablet Take 200 mg by mouth 2 (two) times daily. 08/30/19  Yes [provider]  montelukast (SINGULAIR) 10 MG tablet Take 1 tablet (10 mg total) by mouth at bedtime. 07/24/19  Yes Padgett, Pilar Grammes, MD  omeprazole (PRILOSEC) 40 MG capsule Take 1 capsule (40 mg total) by mouth every morning. 05/10/19  Yes Kozlow, Alvira Philips, MD  spironolactone (ALDACTONE) 25 MG tablet Take 25 mg by mouth daily. 05/09/19  Yes [provider]     Critical care time: 35 minutes

## 2019-09-15 NOTE — Progress Notes (Signed)
ANTICOAGULATION CONSULT NOTE   Pharmacy Consult for Heparin Indication: pulmonary embolus  Allergies  Allergen Reactions  . Metronidazole Hives    Patient Measurements: Height: 5' 3.5" (161.3 cm) Weight: (!) 167.7 kg (369 lb 11.2 oz) IBW/kg (Calculated) : 53.55 Heparin Dosing Weight: 97 kg  Vital Signs: Temp: 97.8 F (36.6 C) (06/26 1537) Temp Source: Oral (06/26 1537) BP: 130/98 (06/26 1345) Pulse Rate: 61 (06/26 1445)  Labs: Recent Labs    09/15/19 0033 09/15/19 1228 09/15/19 1402 09/15/19 1632  HGB  --  13.7 13.6  --   HCT  --  43.3 42.4  --   PLT  --  258 252  --   APTT  --   --  99* 48*  LABPROT  --  15.3* 22.6*  --   INR  --  1.3* 2.1*  --   HEPARINUNFRC 0.20*  --   --   --   CREATININE  --  1.31*  --   --     Estimated Creatinine Clearance: 92.1 mL/min (A) (by C-G formula based on SCr of 1.31 mg/dL (H)).   Medical History: Past Medical History:  Diagnosis Date  . Deviated septum   . Dyspnea   . Gout   . High blood pressure   . PCOS (polycystic ovarian syndrome)   . Renal artery stenosis (HCC)   . Sleep apnea     Medications:  See electronic med rec  Assessment: 39 y.o. F presented to [facility] and found to have submassive PE.  No AC PTA. Heparin drip initially started then she became acutely unstable and heparin stopped and TPA given  aptt < 80sec  Restart heparin drip 2100 uts/hr, cbc stable, no bleeding, breathing improved  Goal of Therapy:  Heparin level 0.3-0.7 units/ml Monitor platelets by anticoagulation protocol: Yes   Plan:  restart heparin at 2100 units/hr  heparin level 6hr after restart  Will f/u daily heparin level and CBC    Leota Sauers Pharm.D. CPP, BCPS Clinical Pharmacist [phone] 09/15/2019 5:29 PM    [facility] pharmacy phone numbers are listed on [email or web address]

## 2019-09-16 ENCOUNTER — Inpatient Hospital Stay (HOSPITAL_COMMUNITY): Payer: BC Managed Care – PPO

## 2019-09-16 DIAGNOSIS — Z86718 Personal history of other venous thrombosis and embolism: Secondary | ICD-10-CM | POA: Insufficient documentation

## 2019-09-16 DIAGNOSIS — I701 Atherosclerosis of renal artery: Secondary | ICD-10-CM

## 2019-09-16 DIAGNOSIS — I2602 Saddle embolus of pulmonary artery with acute cor pulmonale: Secondary | ICD-10-CM

## 2019-09-16 DIAGNOSIS — I2699 Other pulmonary embolism without acute cor pulmonale: Secondary | ICD-10-CM

## 2019-09-16 DIAGNOSIS — M7989 Other specified soft tissue disorders: Secondary | ICD-10-CM

## 2019-09-16 LAB — BASIC METABOLIC PANEL
Anion gap: 13 (ref 5–15)
BUN: 12 mg/dL (ref 6–20)
CO2: 20 mmol/L — ABNORMAL LOW (ref 22–32)
Calcium: 8.4 mg/dL — ABNORMAL LOW (ref 8.9–10.3)
Chloride: 104 mmol/L (ref 98–111)
Creatinine, Ser: 1.01 mg/dL — ABNORMAL HIGH (ref 0.44–1.00)
GFR calc Af Amer: >60 mL/min (ref >60)
GFR calc non Af Amer: >60 mL/min (ref >60)
Glucose, Bld: 176 mg/dL — ABNORMAL HIGH (ref 70–99)
Potassium: 3.8 mmol/L (ref 3.5–5.1)
Sodium: 137 mmol/L (ref 135–145)

## 2019-09-16 LAB — CBC
HCT: 38.3 % (ref 36.0–46.0)
Hemoglobin: 12.1 g/dL (ref 12.0–15.0)
MCH: 31 pg (ref 26.0–34.0)
MCHC: 31.6 g/dL (ref 30.0–36.0)
MCV: 98.2 fL (ref 80.0–100.0)
Platelets: 242 10*3/uL (ref 150–400)
RBC: 3.9 MIL/uL (ref 3.87–5.11)
RDW: 13.5 % (ref 11.5–15.5)
WBC: 9.4 10*3/uL (ref 4.0–10.5)
nRBC: 0 % (ref 0.0–0.2)

## 2019-09-16 LAB — ECHOCARDIOGRAM COMPLETE
Height: 63.5 in
Weight: 5915.2 oz

## 2019-09-16 LAB — HEPARIN LEVEL (UNFRACTIONATED): Heparin Unfractionated: 0.44 IU/mL (ref 0.30–0.70)

## 2019-09-16 MED ORDER — PERFLUTREN LIPID MICROSPHERE
1.0000 mL | INTRAVENOUS | Status: AC | PRN
  Administered 2019-09-16: 4 mL via INTRAVENOUS
  Filled 2019-09-16: qty 10

## 2019-09-16 MED ORDER — POTASSIUM CHLORIDE 10 MEQ/50ML IV SOLN: 10.0000 meq | INTRAVENOUS | Status: DC

## 2019-09-16 MED ORDER — POLYETHYLENE GLYCOL 3350 17 G PO PACK: 17.0000 g | PACK | Freq: Every day | ORAL | Status: DC

## 2019-09-16 MED ORDER — POLYETHYLENE GLYCOL 3350 17 G PO PACK
17.0000 g | PACK | Freq: Every day | ORAL | Status: DC | PRN
  Filled 2019-09-16: qty 1

## 2019-09-16 NOTE — Consult Note (Signed)
   NAMERoann Beard, MRN:  [id number], DOB:  1980-11-18, LOS: 2 ADMISSION DATE:  09/14/2019, CONSULTATION DATE:  09/15/19 REFERRING MD:  Macky Lower, CHIEF COMPLAINT:  SOB   Brief History   39 year old w/ hx OSA, HTN, RAS p/w high risk submassive PE failing conservative therapy.  History of present illness   39 year old w/ hx OSA, HTN, RAS presenting to [place] with progressive SOB over past several days.  CTA showed submassive PE, transferred to [facility] for further management this AM.  This morning, worsening chest pain, SOB, vomiting, light-headedness. PCCM consulted for further recs.  No prior VTE. No history of surgery, stroke.  Never hospitalized before.  Past Medical History  OSA Gout PCOS HTN RAS  Significant Hospital Events   6/26 admitted  Consults:  N/A  Procedures:  N/A  Significant Diagnostic Tests:  CTA chest personally reviewed: suboptimal contrast timing, large clot burden though with significant RV strain. Trop/BNP elevated at OSH  Micro Data:  COVID neg  Antimicrobials:  None   Interim history/subjective:  No events. Breathing easier. Some persistent pleurisy Bps a bit low.  Objective   Blood pressure (!) 87/61, pulse 62, temperature 98.3 F (36.8 C), resp. rate (!) 21, height 5' 3.5" (1.613 m), weight (!) 167.7 kg, SpO2 96 %.        Intake/Output Summary (Last 24 hours) at 09/16/2019 0750 Last data filed at 09/15/2019 2345 Gross per 24 hour  Intake 363 ml  Output 1000 ml  Net -637 ml   Filed Weights   09/14/19 2137 09/15/19 0053 09/15/19 0544  Weight: (!) 168.1 kg (!) 167.7 kg (!) 167.7 kg    Examination: General: young woman sitting up in bed eating breakfast HENT: malampatti 4, MMM Lungs: Clear, no wheezing, tachypneic Cardiovascular: ext warm, RRR Abdomen: Soft, +BS Extremities: difficult to tease out adipose tissue from edema Neuro: GCS15, moves all 4 ext to command Skin: no rashes  Labs reviewed, improved kidney  function  Resolved Hospital Problem list   N/A  Assessment & Plan:  High risk submassive PE with signs of poor forward flow: s/p tpa 6/26, now improved.  On heparin gtt.  Unprovoked.  - Switch to NoAC depending on patient insurance coverage, do not need to give 'intro' dose since got tpa  - f/u echo and LE dopplers mostly for patient info, if RV down on echo needs ~2-3 month f/u echo as OP  - Needs lifelong AC from my standpoint since no trigger to this  - Hold BB today and consider sending out on lower dose  - Stable for med tele transfer, hopefully home soon'  - Will sign off, call if further questions or concerns  Myrla Halsted MD PCCM

## 2019-09-16 NOTE — Progress Notes (Signed)
  Echocardiogram 2D Echocardiogram has been performed with [facility].  Joan Beard 09/16/2019, 10:57 AM

## 2019-09-16 NOTE — Care Management (Signed)
Benefit checks for drug copays are unavailable on the weekends.  If desired, script can be sent to an open pharmacy and copay can be checked with the pharmacist. Per online BCBS system, both Xarelto and Eliquis are covered but  copays are unavailable.  Drug card will be provided when drug is chosen.    Patient's listed pharmacies are not open on Sunday.

## 2019-09-16 NOTE — Progress Notes (Signed)
PROGRESS NOTE    Joan Beard  YQM:[id number] DOB: 1980-06-25 DOA: 09/14/2019 PCP: Garrel Ridgel, FNP    Brief Narrative:  Joan Beard is a 39 y.o. female with medical history significant of morbid obesity, HTN, OSA, RAS presented to [facility] ED due to progressive SOB/DOE. Eval in the ED included BNP elevated at 1880, D-dimer 3039, Troponin 0.8, Cr 1.10.  CTA reveaed large amount of thrombus in bilateral pulmonary arteries L>R with main arterial and lower lob involvement. Also right heart strain c/w submassive PE.  Heparin qtt started. Patient transferred to [facility] for continue treatment Yesterday patient started having worsening chest pain with shortness of breath, lightheadedness.  She also had some diaphoresis.  Consulted PCCM.  Patient received TPA per PCCM.  This morning she is still complaining of some chest discomfort but says it is better than before.   Assessment & Plan:   Active Problems:   Pulmonary embolism (HCC)   HTN (hypertension)   OSA (obstructive sleep apnea)   Renal artery stenosis (HCC)   Morbid obesity with BMI of 60.0-69.9, adult (HCC)   PE (pulmonary thromboembolism) (HCC)  1. PE - patient with submassive PE and right heart strain by CTA.  She received TPA on 09/15/2015.  Currently on heparin drip.  This morning she says she is still having some chest pain but improving. -Appreciate help by PCCM.  For now continue the heparin drip.  Awaiting echo and lower extremity Doppler ultrasound.  2. HTN - continue home regimen  3. OSA- will continue CPAP  4. Morbid obesity -counseled on diet and lifestyle modifications.  5. RAS - renal function stable. BP seems controlled.  DVT prophylaxis: IV heparin Code Status: Full code  family Communication: None at bedside Disposition:   Status is: Inpatient  Remains inpatient appropriate because:Inpatient level of care appropriate due to severity of illness   Dispo: The patient is from: Home               Anticipated d/c is to: Home              Anticipated d/c date is: 2 days              Patient currently is not medically stable to d/c.       Consultants:   PCCM  Procedures:  None  Antimicrobials:   None    Subjective: She is status post TPA on 09/15/2019.  This morning she is still complaining of some chest pain but says it is improving.  Objective: Vitals:   09/16/19 1149 09/16/19 1200 09/16/19 1209 09/16/19 1300  BP:   119/69 108/62  Pulse:  83 82 72  Resp:  (!) 32 19 (!) 22  Temp: 98.6 F (37 C)     TempSrc: Oral     SpO2: 97% 95% 93% 100%  Weight:      Height:        Intake/Output Summary (Last 24 hours) at 09/16/2019 1333 Last data filed at 09/16/2019 1200 Gross per 24 hour  Intake 1104.37 ml  Output 1800 ml  Net -695.63 ml   Filed Weights   09/14/19 2137 09/15/19 0053 09/15/19 0544  Weight: (!) 168.1 kg (!) 167.7 kg (!) 167.7 kg    Examination:  General exam: Morbidly obese female, not in any acute distress at this time Respiratory system: Clear to auscultation.  Tachypneic Cardiovascular system: S1 & S2 heard, RRR. No murmurs  Gastrointestinal system: Morbidly obese, nontender  Central nervous system: Alert and  oriented. No focal neurological deficits. Extremities: Symmetric 5 x 5 power. Skin: No rashes, lesions or ulcers Psychiatry: Judgement and insight appear normal. Mood & affect appropriate.     Data Reviewed: I have personally reviewed following labs and imaging studies  CBC: Recent Labs  Lab 09/15/19 1228 09/15/19 1402 09/16/19 0336  WBC 8.0 11.2* 9.4  HGB 13.7 13.6 12.1  HCT 43.3 42.4 38.3  MCV 97.7 97.0 98.2  PLT 258 252 242    Basic Metabolic Panel: Recent Labs  Lab 09/15/19 1228 09/16/19 0336  NA 140 137  K 4.2 3.8  CL 105 104  CO2 22 20*  GLUCOSE 171* 176*  BUN 13 12  CREATININE 1.31* 1.01*  CALCIUM 8.9 8.4*    GFR: Estimated Creatinine Clearance: 119.4 mL/min (A) (by C-G formula based on SCr of 1.01 mg/dL  (H)).  Liver Function Tests: No results for input(s): AST, ALT, ALKPHOS, BILITOT, PROT, ALBUMIN in the last 168 hours.  CBG: No results for input(s): GLUCAP in the last 168 hours.   Recent Results (from the past 240 hour(s))  SARS Coronavirus 2 by RT PCR (hospital order, performed in [facility] hospital lab) Nasopharyngeal Nasopharyngeal Swab     Status: None   Collection Time: 09/14/19 10:35 PM   Specimen: Nasopharyngeal Swab  Result Value Ref Range Status   SARS Coronavirus 2 NEGATIVE NEGATIVE Final    Comment: (NOTE) SARS-CoV-2 target nucleic acids are NOT DETECTED.  The SARS-CoV-2 RNA is generally detectable in upper and lower respiratory specimens during the acute phase of infection. The lowest concentration of SARS-CoV-2 viral copies this assay can detect is 250 copies / mL. A negative result does not preclude SARS-CoV-2 infection and should not be used as the sole basis for treatment or other patient management decisions.  A negative result may occur with improper specimen collection / handling, submission of specimen other than nasopharyngeal swab, presence of viral mutation(s) within the areas targeted by this assay, and inadequate number of viral copies (<250 copies / mL). A negative result must be combined with clinical observations, patient history, and epidemiological information.  Fact Sheet for Patients:   BoilerBrush.com.cy  Fact Sheet for Healthcare Providers: https://pope.com/  This test is not yet approved or  cleared by the [place] FDA and has been authorized for detection and/or diagnosis of SARS-CoV-2 by FDA under an Emergency Use Authorization (EUA).  This EUA will remain in effect (meaning this test can be used) for the duration of the COVID-19 declaration under Section 564(b)(1) of the Act, 21 U.S.C. section 360bbb-3(b)(1), unless the authorization is terminated or revoked sooner.  Performed at  [facility], [address] [place], [place] [address]   MRSA PCR Screening     Status: None   Collection Time: 09/15/19 11:42 AM   Specimen: Nasal Mucosa; Nasopharyngeal  Result Value Ref Range Status   MRSA by PCR NEGATIVE NEGATIVE Final    Comment:        The GeneXpert MRSA Assay (FDA approved for NASAL specimens only), is one component of a comprehensive MRSA colonization surveillance program. It is not intended to diagnose MRSA infection nor to guide or monitor treatment for MRSA infections. Performed at [facility], [address], [place], [place] [address]          Radiology Studies: ECHOCARDIOGRAM COMPLETE  Result Date: 09/16/2019    ECHOCARDIOGRAM REPORT   Patient Name:   Joan Beard Date of Exam: 09/16/2019 Medical Rec #:  [id number]  Height:       63.5 in Accession #:    [id number]   Weight:       369.7 lb Date of Birth:  07-06-1980    BSA:          2.525 m Patient Age:    39 years     BP:           87/61 mmHg Patient Gender: F            HR:           62 bpm. Exam Location:  Inpatient Procedure: 2D Echo, Cardiac Doppler, Color Doppler and Intracardiac            Opacification Agent Indications:    I26.02 Pulmonary embolus  History:        Patient has no prior history of Echocardiogram examinations.                 Risk Factors:Sleep Apnea and Hypertension.  Sonographer:    Clayton Lefort RDCS (AE) Referring Phys: [id number] MICHAEL E NORINS  Sonographer Comments: Suboptimal apical window, no subcostal window and patient is morbidly obese. Image acquisition challenging due to patient body habitus. IMPRESSIONS  1. Images are limited.  2. Left ventricular ejection fraction, by estimation, is 60 to 65%. The left ventricle has normal function. The left ventricle has no regional wall motion abnormalities. There is moderate left ventricular hypertrophy. Left ventricular diastolic parameters were normal.  3. Right ventricular systolic function is moderately reduced. The right  ventricular size is mildly enlarged. RV-RA gradient 22 mmHg.  4. The aortic valve is tricuspid. Aortic valve regurgitation is not visualized.  5. Unable to estimate CVP.  6. The mitral valve is grossly normal. Trivial mitral valve regurgitation. FINDINGS  Left Ventricle: Left ventricular ejection fraction, by estimation, is 60 to 65%. The left ventricle has normal function. The left ventricle has no regional wall motion abnormalities. Definity contrast agent was given IV to delineate the left ventricular  endocardial borders. The left ventricular internal cavity size was normal in size. There is moderate left ventricular hypertrophy. Left ventricular diastolic parameters were normal. Right Ventricle: The right ventricular size is mildly enlarged. No increase in right ventricular wall thickness. Right ventricular systolic function is moderately reduced. Left Atrium: Left atrial size was normal in size. Right Atrium: Right atrial size was normal in size. Pericardium: Trivial pericardial effusion is present. The pericardial effusion is posterior to the left ventricle. Mitral Valve: The mitral valve is grossly normal. Trivial mitral valve regurgitation. Tricuspid Valve: The tricuspid valve is grossly normal. Tricuspid valve regurgitation is trivial. Aortic Valve: The aortic valve is tricuspid. Aortic valve regurgitation is not visualized. Aortic valve mean gradient measures 6.0 mmHg. Aortic valve peak gradient measures 10.8 mmHg. Aortic valve area, by VTI measures 2.08 cm. Pulmonic Valve: The pulmonic valve was grossly normal. Pulmonic valve regurgitation is trivial. Aorta: The aortic root is normal in size and structure. Venous: Unable to estimate CVP. The inferior vena cava was not well visualized. IAS/Shunts: The interatrial septum was not well visualized.  LEFT VENTRICLE PLAX 2D LVIDd:         4.30 cm  Diastology LVIDs:         2.80 cm  LV e' lateral:   8.92 cm/s LV PW:         1.50 cm  LV E/e' lateral: 7.7 LV IVS:         1.50 cm  LV e' medial:  8.49 cm/s LVOT diam:     1.90 cm  LV E/e' medial:  8.0 LV SV:         63 LV SV Index:   25 LVOT Area:     2.84 cm  LEFT ATRIUM             Index LA diam:        3.00 cm 1.19 cm/m LA Vol (A2C):   45.8 ml 18.14 ml/m LA Vol (A4C):   35.0 ml 13.86 ml/m LA Biplane Vol: 40.6 ml 16.08 ml/m  AORTIC VALVE AV Area (Vmax):    2.20 cm AV Area (Vmean):   2.09 cm AV Area (VTI):     2.08 cm AV Vmax:           164.00 cm/s AV Vmean:          109.000 cm/s AV VTI:            0.304 m AV Peak Grad:      10.8 mmHg AV Mean Grad:      6.0 mmHg LVOT Vmax:         127.00 cm/s LVOT Vmean:        80.500 cm/s LVOT VTI:          0.223 m LVOT/AV VTI ratio: 0.73  AORTA Ao Root diam: 3.00 cm Ao Asc diam:  2.80 cm MITRAL VALVE               TRICUSPID VALVE MV Area (PHT): 2.11 cm    TR Peak grad:   21.5 mmHg MV Decel Time: 359 msec    TR Vmax:        232.00 cm/s MV E velocity: 68.30 cm/s MV A velocity: 35.30 cm/s  SHUNTS MV E/A ratio:  1.93        Systemic VTI:  0.22 m                            Systemic Diam: 1.90 cm Nona Dell MD Electronically signed by Nona Dell MD Signature Date/Time: 09/16/2019/12:27:30 PM    Final    VAS [place] LOWER EXTREMITY VENOUS (DVT)  Result Date: 09/16/2019  Lower Venous DVTStudy Indications: Pulmonary embolism, and Edema.  Limitations: Body habitus. Comparison Study: No prior study on file Performing Technologist: Sherren Kerns RVS  Examination Guidelines: A complete evaluation includes B-mode imaging, spectral Doppler, color Doppler, and power Doppler as needed of all accessible portions of each vessel. Bilateral testing is considered an integral part of a complete examination. Limited examinations for reoccurring indications may be performed as noted. The reflux portion of the exam is performed with the patient in reverse Trendelenburg.  +---------+---------------+---------+-----------+----------+--------------+ RIGHT     CompressibilityPhasicitySpontaneityPropertiesThrombus Aging +---------+---------------+---------+-----------+----------+--------------+ CFV      Full           Yes      Yes                                 +---------+---------------+---------+-----------+----------+--------------+ FV Prox  None                                         Acute          +---------+---------------+---------+-----------+----------+--------------+ FV Mid  Not visualized +---------+---------------+---------+-----------+----------+--------------+ FV DistalNone                                         Acute          +---------+---------------+---------+-----------+----------+--------------+ PFV                                                   Not visualized +---------+---------------+---------+-----------+----------+--------------+ POP      Full           Yes      Yes                                 +---------+---------------+---------+-----------+----------+--------------+ PTV                                                   Not visualized +---------+---------------+---------+-----------+----------+--------------+ PERO                                                  Not visualized +---------+---------------+---------+-----------+----------+--------------+   +---------+---------------+---------+-----------+----------+-------------------+ LEFT     CompressibilityPhasicitySpontaneityPropertiesThrombus Aging      +---------+---------------+---------+-----------+----------+-------------------+ CFV      Full           Yes      Yes                                      +---------+---------------+---------+-----------+----------+-------------------+ SFJ      Full                                                             +---------+---------------+---------+-----------+----------+-------------------+ FV Prox                                                patent by color and                                                       Doppler             +---------+---------------+---------+-----------+----------+-------------------+ FV Mid                                                Not visualized      +---------+---------------+---------+-----------+----------+-------------------+ FV Distal  Not visualized      +---------+---------------+---------+-----------+----------+-------------------+ PFV                                                   Not visualized      +---------+---------------+---------+-----------+----------+-------------------+ POP                                                   patent by color and                                                       Doppler             +---------+---------------+---------+-----------+----------+-------------------+ PTV                                                   Not visualized      +---------+---------------+---------+-----------+----------+-------------------+ PERO                                                  Not visualized      +---------+---------------+---------+-----------+----------+-------------------+     Summary: RIGHT: - Findings consistent with acute deep vein thrombosis involving the right femoral vein.  LEFT: - There is no evidence of deep vein thrombosis in the lower extremity. However, portions of this examination were limited- see technologist comments above.  *See table(s) above for measurements and observations.    Preliminary         Scheduled Meds: . allopurinol  100 mg Oral Daily  . azelastine  1 spray Each Nare Daily   And  . fluticasone  1 spray Each Nare Daily  . Chlorhexidine Gluconate Cloth  6 each Topical Daily  . cyproheptadine  4 mg Oral QHS  . loratadine  10 mg Oral Daily  . montelukast  10 mg Oral QHS  . pantoprazole  40 mg  Oral Daily  . sodium chloride flush  3 mL Intravenous Q12H  . spironolactone  25 mg Oral Daily  . traZODone  25 mg Oral QHS   Continuous Infusions: . sodium chloride    . heparin 2,100 Units/hr (09/16/19 1200)     LOS: 2 days    Vonzella Nipple, MD [facility]   To contact the attending provider between 7A-7P or the covering provider during after hours 7P-7A, please log into the web site [email or web address] and access using universal [place] password for that web site. If you do not have the password, please call the hospital operator.  09/16/2019, 1:33 PM

## 2019-09-16 NOTE — Progress Notes (Signed)
VASCULAR LAB   Bilateral lower extremity venous duplex completed.    Preliminary report:  See CV proc for preliminary results.  Stevan Eberwein, RVT 09/16/2019, 11:04 AM

## 2019-09-16 NOTE — Progress Notes (Signed)
ANTICOAGULATION CONSULT NOTE   Pharmacy Consult for Heparin Indication: pulmonary embolus  Allergies  Allergen Reactions  . Metronidazole Hives    Patient Measurements: Height: 5' 3.5" (161.3 cm) Weight: (!) 167.7 kg (369 lb 11.2 oz) IBW/kg (Calculated) : 53.55 Heparin Dosing Weight: 97 kg  Vital Signs: Temp: 98.3 F (36.8 C) (06/27 0722) Temp Source: Oral (06/27 0300) BP: 125/77 (06/27 0900) Pulse Rate: 69 (06/27 0921)  Labs: Recent Labs    09/15/19 0033 09/15/19 1228 09/15/19 1228 09/15/19 1402 09/15/19 1632 09/15/19 2323 09/16/19 0336  HGB  --  13.7   < > 13.6  --   --  12.1  HCT  --  43.3  --  42.4  --   --  38.3  PLT  --  258  --  252  --   --  242  APTT  --   --   --  99* 48*  --   --   LABPROT  --  15.3*  --  22.6*  --   --   --   INR  --  1.3*  --  2.1*  --   --   --   HEPARINUNFRC 0.20*  --   --   --   --  0.42 0.44  CREATININE  --  1.31*  --   --   --   --  1.01*   < > = values in this interval not displayed.    Estimated Creatinine Clearance: 119.4 mL/min (A) (by C-G formula based on SCr of 1.01 mg/dL (H)).   Medical History: Past Medical History:  Diagnosis Date  . Deviated septum   . Dyspnea   . Gout   . High blood pressure   . PCOS (polycystic ovarian syndrome)   . Renal artery stenosis (HCC)   . Sleep apnea     Medications:  See electronic med rec  Assessment: 39 y.o. F presented to [facility] and found to have submassive PE. Heparin bolus 5000 units and gtt at 1800 units/hr started ~1540. To continue heparin upon transfer. No AC PTA.  APTT s/p TPA is now 99.  Goal of Therapy:  Heparin level 0.3-0.7 units/ml Monitor platelets by anticoagulation protocol: Yes   Plan:  Continue heparin at 2100 units/hr Will f/u daily heparin level and CBC Awaiting case management help in determining cost of DOAC at discharge.  Reece Leader, Colon Flattery, [facility] Clinical Pharmacist  09/16/2019 9:47 AM   [facility] pharmacy phone numbers are  listed on [email or web address]

## 2019-09-16 NOTE — Progress Notes (Signed)
RT placed pt on CPAP dream station earlier in the night. Pt tolerating well. Pt on auto titrate max 12 min 6 w/3 lpm bled into the system. RT will continue to monitor.

## 2019-09-17 DIAGNOSIS — I82411 Acute embolism and thrombosis of right femoral vein: Secondary | ICD-10-CM

## 2019-09-17 LAB — BASIC METABOLIC PANEL
Anion gap: 9 (ref 5–15)
BUN: 8 mg/dL (ref 6–20)
CO2: 24 mmol/L (ref 22–32)
Calcium: 8.5 mg/dL — ABNORMAL LOW (ref 8.9–10.3)
Chloride: 106 mmol/L (ref 98–111)
Creatinine, Ser: 1.04 mg/dL — ABNORMAL HIGH (ref 0.44–1.00)
GFR calc Af Amer: >60 mL/min (ref >60)
GFR calc non Af Amer: >60 mL/min (ref >60)
Glucose, Bld: 194 mg/dL — ABNORMAL HIGH (ref 70–99)
Potassium: 4.2 mmol/L (ref 3.5–5.1)
Sodium: 139 mmol/L (ref 135–145)

## 2019-09-17 LAB — CBC
HCT: 36.9 % (ref 36.0–46.0)
Hemoglobin: 11.4 g/dL — ABNORMAL LOW (ref 12.0–15.0)
MCH: 31 pg (ref 26.0–34.0)
MCHC: 30.9 g/dL (ref 30.0–36.0)
MCV: 100.3 fL — ABNORMAL HIGH (ref 80.0–100.0)
Platelets: 271 10*3/uL (ref 150–400)
RBC: 3.68 MIL/uL — ABNORMAL LOW (ref 3.87–5.11)
RDW: 13.4 % (ref 11.5–15.5)
WBC: 9 10*3/uL (ref 4.0–10.5)
nRBC: 0 % (ref 0.0–0.2)

## 2019-09-17 LAB — HEPARIN LEVEL (UNFRACTIONATED): Heparin Unfractionated: 0.5 IU/mL (ref 0.30–0.70)

## 2019-09-17 LAB — GLUCOSE, CAPILLARY: Glucose-Capillary: 125 mg/dL — ABNORMAL HIGH (ref 70–99)

## 2019-09-17 MED ORDER — LABETALOL HCL 100 MG PO TABS: 100.0000 mg | ORAL_TABLET | Freq: Two times a day (BID) | ORAL | 0 refills | Status: AC

## 2019-09-17 MED ORDER — APIXABAN 5 MG PO TABS
5.0000 mg | ORAL_TABLET | Freq: Two times a day (BID) | ORAL | Status: DC
  Administered 2019-09-17: 5 mg via ORAL
  Filled 2019-09-17: qty 1

## 2019-09-17 MED ORDER — APIXABAN 5 MG PO TABS: 5.0000 mg | ORAL_TABLET | Freq: Two times a day (BID) | ORAL | 2 refills | Status: DC

## 2019-09-17 NOTE — Progress Notes (Addendum)
ANTICOAGULATION CONSULT NOTE   Pharmacy Consult for Heparin Indication: pulmonary embolus  Allergies  Allergen Reactions  . Metronidazole Hives    Patient Measurements: Height: 5' 3.5" (161.3 cm) Weight: (!) 167.7 kg (369 lb 11.2 oz) IBW/kg (Calculated) : 53.55 Heparin Dosing Weight: 97 kg  Vital Signs: Temp: 97.7 F (36.5 C) (06/28 0758) Temp Source: Axillary (06/28 0758) BP: 126/56 (06/28 0758) Pulse Rate: 61 (06/28 0758)  Labs: Recent Labs    09/15/19 0033 09/15/19 1228 09/15/19 1228 09/15/19 1402 09/15/19 1402 09/15/19 1632 09/15/19 2323 09/16/19 0336 09/17/19 0418  HGB  --  13.7   < > 13.6   < >  --   --  12.1 11.4*  HCT  --  43.3   < > 42.4  --   --   --  38.3 36.9  PLT  --  258   < > 252  --   --   --  242 271  APTT  --   --   --  99*  --  48*  --   --   --   LABPROT  --  15.3*  --  22.6*  --   --   --   --   --   INR  --  1.3*  --  2.1*  --   --   --   --   --   HEPARINUNFRC   < >  --   --   --   --   --  0.42 0.44 0.50  CREATININE  --  1.31*  --   --   --   --   --  1.01* 1.04*   < > = values in this interval not displayed.    Estimated Creatinine Clearance: 116 mL/min (A) (by C-G formula based on SCr of 1.04 mg/dL (H)).  Assessment: 39 y.o. F presented to [facility] and found to have submassive PE.   Hep lvl within goal 0.5  Cbc  Copays of apixaban and rivaroxaban $30 / month  Goal of Therapy:  Heparin level 0.3-0.7 units/ml Monitor platelets by anticoagulation protocol: Yes   Plan:  Continue heparin at 2100 units/hr Daily hep lvl cbc F/U change to oral AC - no need to load per CCM  Elmer Sow, PharmD, BCPS, BCCCP Clinical Pharmacist [phone]  Please check AMION for all [facility] Pharmacy numbers  09/17/2019 11:10 AM   12:31 PM Addendum:  Convert to apixaban per MD  Plan: Apixaban 5 mg BID

## 2019-09-17 NOTE — Plan of Care (Signed)
Pt's care plan goal met

## 2019-09-17 NOTE — Discharge Summary (Addendum)
Physician Discharge Summary  Joan Beard UJW:[id number] DOB: February 17, 1981 DOA: 09/14/2019  PCP: Patient, No Pcp Per  Admit date: 09/14/2019 Discharge date: 09/17/2019  Admitted From: Home Disposition:  Home  Recommendations for Outpatient Follow-up:  1. Follow up with PCP in 1-2 weeks 2. Please obtain BMP/CBC in one week  Home Health: No Equipment/Devices: No  Discharge Condition: stable CODE STATUS: Full Diet recommendation: Heart Healthy    Brief/Interim Summary: Joan Beard a 39 y.o.femalewith medical history significant ofmorbid obesity, HTN, OSA, RAS presented to [facility] ED due to progressive SOB/DOE. Eval in the ED included BNP elevated at 1880, D-dimer 3039, Troponin 0.8, Cr 1.10. CTA reveaed large amount of thrombus in bilateral pulmonary arteries L>R with main arterial and lower lob involvement. Also right heart strain c/w submassive PE. Heparin qtt started. Patient transferred to [facility] for treatment. After admission here she started having worsening chest pain with shortness of breath, lightheadedness.  She also had some diaphoresis.  Consulted PCCM.  Patient received TPA per PCCM.  She improved significantly and says that her symptoms have resolved at this time.  Lower extremity Doppler ultrasound showed findings consistent with acute DVT involving the right femoral V.  Discharge Diagnoses:  Active Problems:   Pulmonary embolism (HCC)   HTN (hypertension)   OSA (obstructive sleep apnea)   Renal artery stenosis (HCC)   Morbid obesity with BMI of 60.0-69.9, adult (HCC)   PE (pulmonary thromboembolism) (HCC)  1. PE - patient with submassive PE and right heart strain by CTA.  She received TPA on 09/15/2015.  Treated with heparin drip.    Lower extremity Doppler ultrasound showed acute DVT involving the right femoral vein. 2D echo showed LVEF around 65%.  Moderate LVH, mild enlargement of the right ventricule.  Right ventricular systolic function moderately  reduced. -Patient at this time says her symptoms are a lot better.  She is being switched to Eliquis and being discharged on Eliquis. -Advised to follow-up with her primary care physician within 1 week.  She is also advised to follow-up with outpatient hematology clinic.  She says she will discuss with her PCP to refer her to outpatient hematology. She probably will need a repeat echocardiogram in 2 to 3 months to evaluate.  2. HTN -she is on Aldactone and labetalol at home.  Initial blood pressure was low but now improving.  Continue Aldactone.  Will decrease the dose of labetalol.  Advised to follow-up with her primary care physician and nephrology within this week to have her blood pressure monitored and medications adjusted accordingly.  3. OSA-encouraged to continue and be compliant with CPAP  4. Morbid obesity -counseled on diet and lifestyle modifications, weight reduction.  5. RAS - renal function stable. BP stable at this time.  Discharge Instructions Discharge plan of care discussed with the patient.  Answered her questions appropriately to the best of my knowledge patient verbalized understanding and is being discharged home in stable condition.  Allergies as of 09/17/2019      Reactions   Metronidazole Hives      Medication List    STOP taking these medications   albuterol 108 (90 Base) MCG/ACT inhaler Commonly known as: VENTOLIN HFA   famotidine 40 MG tablet Commonly known as: PEPCID     TAKE these medications   allopurinol 100 MG tablet Commonly known as: ZYLOPRIM Take 100 mg by mouth daily.   apixaban 5 MG Tabs tablet Commonly known as: ELIQUIS Take 1 tablet (5 mg total) by mouth 2 (  two) times daily.   cyproheptadine 4 MG tablet Commonly known as: PERIACTIN Take one-half to one tablet by mouth at bedtime as directed.   labetalol 100 MG tablet Commonly known as: NORMODYNE Take 1 tablet (100 mg total) by mouth 2 (two) times daily. What changed:    medication strength  how much to take   montelukast 10 MG tablet Commonly known as: SINGULAIR Take 1 tablet (10 mg total) by mouth at bedtime.   omeprazole 40 MG capsule Commonly known as: PRILOSEC Take 1 capsule (40 mg total) by mouth every morning.   spironolactone 25 MG tablet Commonly known as: ALDACTONE Take 25 mg by mouth daily.       Allergies  Allergen Reactions  . Metronidazole Hives    Consultations:  PCCM   Procedures/Studies: ECHOCARDIOGRAM COMPLETE  Result Date: 09/16/2019    ECHOCARDIOGRAM REPORT   Patient Name:   Joan Beard Date of Exam: 09/16/2019 Medical Rec #:  [id number]    Height:       63.5 in Accession #:    [id number]   Weight:       369.7 lb Date of Birth:  Oct 10, 1980    BSA:          2.525 m Patient Age:    39 years     BP:           87/61 mmHg Patient Gender: F            HR:           62 bpm. Exam Location:  Inpatient Procedure: 2D Echo, Cardiac Doppler, Color Doppler and Intracardiac            Opacification Agent Indications:    I26.02 Pulmonary embolus  History:        Patient has no prior history of Echocardiogram examinations.                 Risk Factors:Sleep Apnea and Hypertension.  Sonographer:    Ross Ludwig RDCS (AE) Referring Phys: [id number] MICHAEL E NORINS  Sonographer Comments: Suboptimal apical window, no subcostal window and patient is morbidly obese. Image acquisition challenging due to patient body habitus. IMPRESSIONS  1. Images are limited.  2. Left ventricular ejection fraction, by estimation, is 60 to 65%. The left ventricle has normal function. The left ventricle has no regional wall motion abnormalities. There is moderate left ventricular hypertrophy. Left ventricular diastolic parameters were normal.  3. Right ventricular systolic function is moderately reduced. The right ventricular size is mildly enlarged. RV-RA gradient 22 mmHg.  4. The aortic valve is tricuspid. Aortic valve regurgitation is not visualized.  5. Unable to estimate  CVP.  6. The mitral valve is grossly normal. Trivial mitral valve regurgitation. FINDINGS  Left Ventricle: Left ventricular ejection fraction, by estimation, is 60 to 65%. The left ventricle has normal function. The left ventricle has no regional wall motion abnormalities. Definity contrast agent was given IV to delineate the left ventricular  endocardial borders. The left ventricular internal cavity size was normal in size. There is moderate left ventricular hypertrophy. Left ventricular diastolic parameters were normal. Right Ventricle: The right ventricular size is mildly enlarged. No increase in right ventricular wall thickness. Right ventricular systolic function is moderately reduced. Left Atrium: Left atrial size was normal in size. Right Atrium: Right atrial size was normal in size. Pericardium: Trivial pericardial effusion is present. The pericardial effusion is posterior to the left ventricle. Mitral Valve: The mitral valve is grossly normal. Trivial  mitral valve regurgitation. Tricuspid Valve: The tricuspid valve is grossly normal. Tricuspid valve regurgitation is trivial. Aortic Valve: The aortic valve is tricuspid. Aortic valve regurgitation is not visualized. Aortic valve mean gradient measures 6.0 mmHg. Aortic valve peak gradient measures 10.8 mmHg. Aortic valve area, by VTI measures 2.08 cm. Pulmonic Valve: The pulmonic valve was grossly normal. Pulmonic valve regurgitation is trivial. Aorta: The aortic root is normal in size and structure. Venous: Unable to estimate CVP. The inferior vena cava was not well visualized. IAS/Shunts: The interatrial septum was not well visualized.  LEFT VENTRICLE PLAX 2D LVIDd:         4.30 cm  Diastology LVIDs:         2.80 cm  LV e' lateral:   8.92 cm/s LV PW:         1.50 cm  LV E/e' lateral: 7.7 LV IVS:        1.50 cm  LV e' medial:    8.49 cm/s LVOT diam:     1.90 cm  LV E/e' medial:  8.0 LV SV:         63 LV SV Index:   25 LVOT Area:     2.84 cm  LEFT ATRIUM              Index LA diam:        3.00 cm 1.19 cm/m LA Vol (A2C):   45.8 ml 18.14 ml/m LA Vol (A4C):   35.0 ml 13.86 ml/m LA Biplane Vol: 40.6 ml 16.08 ml/m  AORTIC VALVE AV Area (Vmax):    2.20 cm AV Area (Vmean):   2.09 cm AV Area (VTI):     2.08 cm AV Vmax:           164.00 cm/s AV Vmean:          109.000 cm/s AV VTI:            0.304 m AV Peak Grad:      10.8 mmHg AV Mean Grad:      6.0 mmHg LVOT Vmax:         127.00 cm/s LVOT Vmean:        80.500 cm/s LVOT VTI:          0.223 m LVOT/AV VTI ratio: 0.73  AORTA Ao Root diam: 3.00 cm Ao Asc diam:  2.80 cm MITRAL VALVE               TRICUSPID VALVE MV Area (PHT): 2.11 cm    TR Peak grad:   21.5 mmHg MV Decel Time: 359 msec    TR Vmax:        232.00 cm/s MV E velocity: 68.30 cm/s MV A velocity: 35.30 cm/s  SHUNTS MV E/A ratio:  1.93        Systemic VTI:  0.22 m                            Systemic Diam: 1.90 cm Nona Dell MD Electronically signed by Nona Dell MD Signature Date/Time: 09/16/2019/12:27:30 PM    Final    VAS [place] LOWER EXTREMITY VENOUS (DVT)  Result Date: 09/16/2019  Lower Venous DVTStudy Indications: Pulmonary embolism, and Edema.  Limitations: Body habitus. Comparison Study: No prior study on file Performing Technologist: Sherren Kerns RVS  Examination Guidelines: A complete evaluation includes B-mode imaging, spectral Doppler, color Doppler, and power Doppler as needed of all accessible portions of each vessel. Bilateral testing is  considered an integral part of a complete examination. Limited examinations for reoccurring indications may be performed as noted. The reflux portion of the exam is performed with the patient in reverse Trendelenburg.  +---------+---------------+---------+-----------+----------+--------------+ RIGHT    CompressibilityPhasicitySpontaneityPropertiesThrombus Aging +---------+---------------+---------+-----------+----------+--------------+ CFV      Full           Yes      Yes                                  +---------+---------------+---------+-----------+----------+--------------+ FV Prox  None                                         Acute          +---------+---------------+---------+-----------+----------+--------------+ FV Mid                                                Not visualized +---------+---------------+---------+-----------+----------+--------------+ FV DistalNone                                         Acute          +---------+---------------+---------+-----------+----------+--------------+ PFV                                                   Not visualized +---------+---------------+---------+-----------+----------+--------------+ POP      Full           Yes      Yes                                 +---------+---------------+---------+-----------+----------+--------------+ PTV                                                   Not visualized +---------+---------------+---------+-----------+----------+--------------+ PERO                                                  Not visualized +---------+---------------+---------+-----------+----------+--------------+   +---------+---------------+---------+-----------+----------+-------------------+ LEFT     CompressibilityPhasicitySpontaneityPropertiesThrombus Aging      +---------+---------------+---------+-----------+----------+-------------------+ CFV      Full           Yes      Yes                                      +---------+---------------+---------+-----------+----------+-------------------+ SFJ      Full                                                             +---------+---------------+---------+-----------+----------+-------------------+  FV Prox                                               patent by color and                                                       Doppler              +---------+---------------+---------+-----------+----------+-------------------+ FV Mid                                                Not visualized      +---------+---------------+---------+-----------+----------+-------------------+ FV Distal                                             Not visualized      +---------+---------------+---------+-----------+----------+-------------------+ PFV                                                   Not visualized      +---------+---------------+---------+-----------+----------+-------------------+ POP                                                   patent by color and                                                       Doppler             +---------+---------------+---------+-----------+----------+-------------------+ PTV                                                   Not visualized      +---------+---------------+---------+-----------+----------+-------------------+ PERO                                                  Not visualized      +---------+---------------+---------+-----------+----------+-------------------+     Summary: RIGHT: - Findings consistent with acute deep vein thrombosis involving the right femoral vein.  LEFT: - There is no evidence of deep vein thrombosis in the lower extremity. However, portions of this examination were limited- see technologist comments above.  *See table(s) above for measurements and observations. Electronically signed by Waverly Ferrarihristopher Dickson MD on 09/16/2019 at 6:52:14 PM.    Final  Subjective: She says that her chest pain has improved.  Discharge Exam: Vitals:   09/17/19 0430 09/17/19 0758  BP: 123/70 (!) 126/56  Pulse: 68 61  Resp: 18 20  Temp: 98.1 F (36.7 C) 97.7 F (36.5 C)  SpO2: 100% 100%   Vitals:   09/17/19 0000 09/17/19 0200 09/17/19 0430 09/17/19 0758  BP: (!) 153/59  123/70 (!) 126/56  Pulse: 83  68 61  Resp: (!) 21 20 18 20   Temp: 99.8 F  (37.7 C)  98.1 F (36.7 C) 97.7 F (36.5 C)  TempSrc: Oral  Axillary Axillary  SpO2: 99%  100% 100%  Weight:      Height:        General: Pt is alert, awake, not in acute distress Cardiovascular: RRR, S1/S2 +, no rubs, no gallops Respiratory: CTA bilaterally, no wheezing, no rhonchi Abdominal: Morbidly obese, soft, NT, ND, bowel sounds + Extremities: no edema, no cyanosis    The results of significant diagnostics from this hospitalization (including imaging, microbiology, ancillary and laboratory) are listed below for reference.     Microbiology: Recent Results (from the past 240 hour(s))  SARS Coronavirus 2 by RT PCR (hospital order, performed in [facility] hospital lab) Nasopharyngeal Nasopharyngeal Swab     Status: None   Collection Time: 09/14/19 10:35 PM   Specimen: Nasopharyngeal Swab  Result Value Ref Range Status   SARS Coronavirus 2 NEGATIVE NEGATIVE Final    Comment: (NOTE) SARS-CoV-2 target nucleic acids are NOT DETECTED.  The SARS-CoV-2 RNA is generally detectable in upper and lower respiratory specimens during the acute phase of infection. The lowest concentration of SARS-CoV-2 viral copies this assay can detect is 250 copies / mL. A negative result does not preclude SARS-CoV-2 infection and should not be used as the sole basis for treatment or other patient management decisions.  A negative result may occur with improper specimen collection / handling, submission of specimen other than nasopharyngeal swab, presence of viral mutation(s) within the areas targeted by this assay, and inadequate number of viral copies (<250 copies / mL). A negative result must be combined with clinical observations, patient history, and epidemiological information.  Fact Sheet for Patients:   StrictlyIdeas.no  Fact Sheet for Healthcare Providers: BankingDealers.co.za  This test is not yet approved or  cleared by the [place]  FDA and has been authorized for detection and/or diagnosis of SARS-CoV-2 by FDA under an Emergency Use Authorization (EUA).  This EUA will remain in effect (meaning this test can be used) for the duration of the COVID-19 declaration under Section 564(b)(1) of the Act, 21 U.S.C. section 360bbb-3(b)(1), unless the authorization is terminated or revoked sooner.  Performed at [facility], [place] [address] [place], [place] [address]   MRSA PCR Screening     Status: None   Collection Time: 09/15/19 11:42 AM   Specimen: Nasal Mucosa; Nasopharyngeal  Result Value Ref Range Status   MRSA by PCR NEGATIVE NEGATIVE Final    Comment:        The GeneXpert MRSA Assay (FDA approved for NASAL specimens only), is one component of a comprehensive MRSA colonization surveillance program. It is not intended to diagnose MRSA infection nor to guide or monitor treatment for MRSA infections. Performed at [facility], [place] [address] [place], [place] [address]      Labs: BNP (last 3 results) No results for input(s): BNP in the last 8760 hours. Basic Metabolic Panel: Recent Labs  Lab 09/15/19 1228 09/16/19  7672 09/17/19 0418  NA 140 137 139  K 4.2 3.8 4.2  CL 105 104 106  CO2 22 20* 24  GLUCOSE 171* 176* 194*  BUN 13 12 8   CREATININE 1.31* 1.01* 1.04*  CALCIUM 8.9 8.4* 8.5*   Liver Function Tests: No results for input(s): AST, ALT, ALKPHOS, BILITOT, PROT, ALBUMIN in the last 168 hours. No results for input(s): LIPASE, AMYLASE in the last 168 hours. No results for input(s): AMMONIA in the last 168 hours. CBC: Recent Labs  Lab 09/15/19 1228 09/15/19 1402 09/16/19 0336 09/17/19 0418  WBC 8.0 11.2* 9.4 9.0  HGB 13.7 13.6 12.1 11.4*  HCT 43.3 42.4 38.3 36.9  MCV 97.7 97.0 98.2 100.3*  PLT 258 252 242 271   Cardiac Enzymes: No results for input(s): CKTOTAL, CKMB, CKMBINDEX, TROPONINI in the last 168 hours. BNP: Invalid input(s): POCBNP CBG: No results for input(s):  GLUCAP in the last 168 hours. D-Dimer No results for input(s): DDIMER in the last 72 hours. Hgb A1c No results for input(s): HGBA1C in the last 72 hours. Lipid Profile No results for input(s): CHOL, HDL, LDLCALC, TRIG, CHOLHDL, LDLDIRECT in the last 72 hours. Thyroid function studies No results for input(s): TSH, T4TOTAL, T3FREE, THYROIDAB in the last 72 hours.  Invalid input(s): FREET3 Anemia work up No results for input(s): VITAMINB12, FOLATE, FERRITIN, TIBC, IRON, RETICCTPCT in the last 72 hours. Urinalysis No results found for: COLORURINE, APPEARANCEUR, LABSPEC, PHURINE, GLUCOSEU, HGBUR, BILIRUBINUR, KETONESUR, PROTEINUR, UROBILINOGEN, NITRITE, LEUKOCYTESUR Sepsis Labs Invalid input(s): PROCALCITONIN,  WBC,  LACTICIDVEN Microbiology Recent Results (from the past 240 hour(s))  SARS Coronavirus 2 by RT PCR (hospital order, performed in [facility] hospital lab) Nasopharyngeal Nasopharyngeal Swab     Status: None   Collection Time: 09/14/19 10:35 PM   Specimen: Nasopharyngeal Swab  Result Value Ref Range Status   SARS Coronavirus 2 NEGATIVE NEGATIVE Final    Comment: (NOTE) SARS-CoV-2 target nucleic acids are NOT DETECTED.  The SARS-CoV-2 RNA is generally detectable in upper and lower respiratory specimens during the acute phase of infection. The lowest concentration of SARS-CoV-2 viral copies this assay can detect is 250 copies / mL. A negative result does not preclude SARS-CoV-2 infection and should not be used as the sole basis for treatment or other patient management decisions.  A negative result may occur with improper specimen collection / handling, submission of specimen other than nasopharyngeal swab, presence of viral mutation(s) within the areas targeted by this assay, and inadequate number of viral copies (<250 copies / mL). A negative result must be combined with clinical observations, patient history, and epidemiological information.  Fact Sheet for Patients:    09/16/19  Fact Sheet for Healthcare Providers: BoilerBrush.com.cy  This test is not yet approved or  cleared by the https://pope.com/ FDA and has been authorized for detection and/or diagnosis of SARS-CoV-2 by FDA under an Emergency Use Authorization (EUA).  This EUA will remain in effect (meaning this test can be used) for the duration of the COVID-19 declaration under Section 564(b)(1) of the Act, 21 U.S.C. section 360bbb-3(b)(1), unless the authorization is terminated or revoked sooner.  Performed at [facility], [address] [place], [place]   MRSA PCR Screening     Status: None   Collection Time: 09/15/19 11:42 AM   Specimen: Nasal Mucosa; Nasopharyngeal  Result Value Ref Range Status   MRSA by PCR NEGATIVE NEGATIVE Final    Comment:        The GeneXpert MRSA  Assay (FDA approved for NASAL specimens only), is one component of a comprehensive MRSA colonization surveillance program. It is not intended to diagnose MRSA infection nor to guide or monitor treatment for MRSA infections. Performed at [facility], [address], [place], [place] [address]      Time coordinating discharge: Over 30 minutes  SIGNED:   Vonzella Nipple, MD  [facility] 09/17/2019, 3:31 PM Pager on amion  If 7PM-7AM, please contact night-coverage [email or web address] Password [email or web address]

## 2019-09-17 NOTE — Progress Notes (Signed)
Patient was stable at discharge. I removed her IV. We reviewed the discharge education. Patient/Family verbalized understanding and had no further questions. Patient left with belongings in hand.   RN gave pt's mother letter from [place] to cover her for work.

## 2019-09-25 DIAGNOSIS — I517 Cardiomegaly: Secondary | ICD-10-CM | POA: Insufficient documentation

## 2019-10-01 DIAGNOSIS — Z7901 Long term (current) use of anticoagulants: Secondary | ICD-10-CM

## 2019-10-01 DIAGNOSIS — I2699 Other pulmonary embolism without acute cor pulmonale: Secondary | ICD-10-CM | POA: Diagnosis not present

## 2019-10-01 DIAGNOSIS — I82411 Acute embolism and thrombosis of right femoral vein: Secondary | ICD-10-CM | POA: Diagnosis not present

## 2019-10-11 ENCOUNTER — Encounter (HOSPITAL_COMMUNITY): Payer: Self-pay

## 2019-10-11 ENCOUNTER — Emergency Department (HOSPITAL_COMMUNITY): Payer: BC Managed Care – PPO

## 2019-10-11 ENCOUNTER — Emergency Department (HOSPITAL_COMMUNITY)
Admission: EM | Admit: 2019-10-11 | Discharge: 2019-10-11 | Disposition: A | Discharge status: Home / Self Care | Payer: BC Managed Care – PPO | Attending: Emergency Medicine | Admitting: Emergency Medicine

## 2019-10-11 DIAGNOSIS — Z91040 Latex allergy status: Secondary | ICD-10-CM | POA: Diagnosis not present

## 2019-10-11 DIAGNOSIS — Z7901 Long term (current) use of anticoagulants: Secondary | ICD-10-CM | POA: Insufficient documentation

## 2019-10-11 DIAGNOSIS — I629 Nontraumatic intracranial hemorrhage, unspecified: Secondary | ICD-10-CM | POA: Diagnosis not present

## 2019-10-11 DIAGNOSIS — Z79899 Other long term (current) drug therapy: Secondary | ICD-10-CM | POA: Diagnosis not present

## 2019-10-11 DIAGNOSIS — Z87891 Personal history of nicotine dependence: Secondary | ICD-10-CM | POA: Diagnosis not present

## 2019-10-11 DIAGNOSIS — I10 Essential (primary) hypertension: Secondary | ICD-10-CM | POA: Diagnosis not present

## 2019-10-11 DIAGNOSIS — M25511 Pain in right shoulder: Secondary | ICD-10-CM | POA: Insufficient documentation

## 2019-10-11 DIAGNOSIS — W010XXA Fall on same level from slipping, tripping and stumbling without subsequent striking against object, initial encounter: Secondary | ICD-10-CM | POA: Diagnosis not present

## 2019-10-11 HISTORY — DX: Deep phlebothrombosis in pregnancy, unspecified trimester: O22.30

## 2019-10-11 HISTORY — DX: Other pulmonary embolism without acute cor pulmonale: I26.99

## 2019-10-11 LAB — CBC
HCT: 39.2 % (ref 36.0–46.0)
Hemoglobin: 12.1 g/dL (ref 12.0–15.0)
MCH: 30.1 pg (ref 26.0–34.0)
MCHC: 30.9 g/dL (ref 30.0–36.0)
MCV: 97.5 fL (ref 80.0–100.0)
Platelets: 319 10*3/uL (ref 150–400)
RBC: 4.02 MIL/uL (ref 3.87–5.11)
RDW: 12.9 % (ref 11.5–15.5)
WBC: 7.5 10*3/uL (ref 4.0–10.5)
nRBC: 0 % (ref 0.0–0.2)

## 2019-10-11 LAB — BASIC METABOLIC PANEL
Anion gap: 10 (ref 5–15)
BUN: 8 mg/dL (ref 6–20)
CO2: 24 mmol/L (ref 22–32)
Calcium: 9.1 mg/dL (ref 8.9–10.3)
Chloride: 102 mmol/L (ref 98–111)
Creatinine, Ser: 0.92 mg/dL (ref 0.44–1.00)
GFR calc Af Amer: >60 mL/min (ref >60)
GFR calc non Af Amer: >60 mL/min (ref >60)
Glucose, Bld: 228 mg/dL — ABNORMAL HIGH (ref 70–99)
Potassium: 4 mmol/L (ref 3.5–5.1)
Sodium: 136 mmol/L (ref 135–145)

## 2019-10-11 NOTE — ED Provider Notes (Signed)
[facility] EMERGENCY DEPARTMENT Provider Note   CSN: [id number] Arrival date & time: 10/11/19  1922     History Chief Complaint  Patient presents with   Joan Beard is a 39 y.o. female.  Patient slipped and fell landing on her right shoulder.  She is on blood thinners.  May be hit her head.  Denies any numbness or tingling or weakness.  Had an x-ray of her right shoulder done at orthopedics today and was negative for fracture.  Sent for further evaluation given that she fell and may be hit her head on blood thinners.  The history is provided by the patient.  Fall This is a new problem. Associated symptoms include chest pain. Pertinent negatives include no abdominal pain, no headaches and no shortness of breath. Associated symptoms comments: Right shoulder pain . Exacerbated by: movement. Nothing relieves the symptoms. She has tried nothing for the symptoms. The treatment provided no relief.       Past Medical History:  Diagnosis Date   Deviated septum    DVT (deep vein thrombosis) in pregnancy    Dyspnea    Gout    High blood pressure    PCOS (polycystic ovarian syndrome)    Pulmonary emboli (HCC)    Renal artery stenosis (HCC)    Sleep apnea     Patient Active Problem List   Diagnosis Date Noted   PE (pulmonary thromboembolism) (HCC) 09/15/2019   Pulmonary embolism (HCC) 09/14/2019   HTN (hypertension) 09/14/2019   OSA (obstructive sleep apnea) 09/14/2019   Renal artery stenosis (HCC) 09/14/2019   Morbid obesity with BMI of 60.0-69.9, adult (HCC) 09/14/2019    Past Surgical History:  Procedure Laterality Date   WISDOM TOOTH EXTRACTION       OB History   No obstetric history on file.     Family History  Problem Relation Age of Onset   Stroke Father    Diabetes Maternal Grandmother    High blood pressure Maternal Grandmother    High blood pressure Paternal Grandmother    Alzheimer's disease Paternal  Grandmother     Social History   Tobacco Use   Smoking status: Former Smoker   Smokeless tobacco: Never Used   Tobacco comment: smoked for less than one year in her teenage years  Substance Use Topics   Alcohol use: Yes   Drug use: Never    Home Medications Prior to Admission medications   Medication Sig Start Date End Date Taking? Authorizing Provider  allopurinol (ZYLOPRIM) 100 MG tablet Take 100 mg by mouth daily. 04/04/19  Yes [provider]  apixaban (ELIQUIS) 5 MG TABS tablet Take 1 tablet (5 mg total) by mouth 2 (two) times daily. 09/17/19  Yes Matcha, Anupama, MD  cholecalciferol (VITAMIN D3) 25 MCG (1000 UNIT) tablet Take 2,000 Units by mouth daily.   Yes [provider]  cyclobenzaprine (FLEXERIL) 5 MG tablet Take 5 mg by mouth 3 (three) times daily as needed for muscle spasms.  10/11/19  Yes [provider]  cyproheptadine (PERIACTIN) 4 MG tablet Take one-half to one tablet by mouth at bedtime as directed. Patient taking differently: Take 4 mg by mouth at bedtime.  07/24/19  Yes Padgett, Pilar Grammes, MD  famotidine (PEPCID) 40 MG tablet Take 40 mg by mouth daily. 10/11/19  Yes [provider]  fluticasone (FLONASE) 50 MCG/ACT nasal spray Place 1 spray into both nostrils daily as needed for allergies or rhinitis.   Yes  [provider]  labetalol (NORMODYNE) 100 MG tablet Take 1 tablet (100 mg total) by mouth 2 (two) times daily. Patient taking differently: Take 100 mg by mouth 3 (three) times daily.  09/17/19  Yes Matcha, Anupama, MD  MAGNESIUM CARBONATE PO Take 1 tablet by mouth daily.   Yes [provider]  montelukast (SINGULAIR) 10 MG tablet Take 1 tablet (10 mg total) by mouth at bedtime. 07/24/19  Yes Padgett, Pilar Grammes, MD  omeprazole (PRILOSEC) 40 MG capsule Take 1 capsule (40 mg total) by mouth every morning. Patient taking differently: Take 40 mg by mouth daily.  05/10/19  Yes Kozlow, Alvira Philips, MD    spironolactone (ALDACTONE) 25 MG tablet Take 25 mg by mouth 2 (two) times daily.  05/09/19  Yes [provider]  traMADol (ULTRAM) 50 MG tablet Take 50 mg by mouth every 8 (eight) hours as needed for moderate pain.  10/11/19   [provider]    Allergies    Metronidazole, Dilaudid [hydromorphone hcl], and Latex  Review of Systems   Review of Systems  Constitutional: Negative for chills and fever.  HENT: Negative for ear pain and sore throat.   Eyes: Negative for pain and visual disturbance.  Respiratory: Negative for cough and shortness of breath.   Cardiovascular: Positive for chest pain. Negative for palpitations.  Gastrointestinal: Negative for abdominal pain and vomiting.  Genitourinary: Negative for dysuria and hematuria.  Musculoskeletal: Positive for arthralgias. Negative for back pain.  Skin: Negative for color change and rash.  Neurological: Negative for seizures, syncope and headaches.  All other systems reviewed and are negative.   Physical Exam Updated Vital Signs BP (!) 148/101 (BP Location: Left Wrist)    Pulse 85    Temp 98.7 F (37.1 C) (Oral)    Resp 16    SpO2 99%   Physical Exam Vitals and nursing note reviewed.  Constitutional:      General: She is not in acute distress.    Appearance: She is well-developed. She is not ill-appearing.  HENT:     Head: Normocephalic and atraumatic.     Nose: Nose normal.     Mouth/Throat:     Mouth: Mucous membranes are moist.  Eyes:     Extraocular Movements: Extraocular movements intact.     Conjunctiva/sclera: Conjunctivae normal.     Pupils: Pupils are equal, round, and reactive to light.  Cardiovascular:     Rate and Rhythm: Normal rate and regular rhythm.     Pulses: Normal pulses.     Heart sounds: Normal heart sounds. No murmur heard.   Pulmonary:     Effort: Pulmonary effort is normal. No respiratory distress.     Breath sounds: Normal breath sounds.  Abdominal:     Palpations: Abdomen is  soft.     Tenderness: There is no abdominal tenderness.  Musculoskeletal:        General: Tenderness present.     Cervical back: Normal range of motion and neck supple. Tenderness present.     Comments: Tenderness in the right shoulder and pain with range of motion  Skin:    General: Skin is warm and dry.     Capillary Refill: Capillary refill takes less than 2 seconds.  Neurological:     General: No focal deficit present.     Mental Status: She is alert and oriented to person, place, and time.     Cranial Nerves: No cranial nerve deficit.     Sensory: No sensory  deficit.     Motor: No weakness.     Coordination: Coordination normal.     Gait: Gait normal.     Comments: 5+ out of 5 strength throughout, normal sensation     ED Results / Procedures / Treatments   Labs (all labs ordered are listed, but only abnormal results are displayed) Labs Reviewed  BASIC METABOLIC PANEL - Abnormal; Notable for the following components:      Result Value   Glucose, Bld 228 (*)    All other components within normal limits  CBC    EKG None  Radiology DG Chest 2 View  Result Date: 10/11/2019 CLINICAL DATA:  Chest pain, fall EXAM: CHEST - 2 VIEW COMPARISON:  09/14/2019 FINDINGS: The heart size and mediastinal contours are within normal limits. Both lungs are clear. The visualized skeletal structures are unremarkable. IMPRESSION: No active cardiopulmonary disease. Electronically Signed   By: Helyn Numbers MD   On: 10/11/2019 20:11   CT Head Wo Contrast  Result Date: 10/11/2019 CLINICAL DATA:  Slipped and fell, hit back of head, anticoagulated EXAM: CT HEAD WITHOUT CONTRAST TECHNIQUE: Contiguous axial images were obtained from the base of the skull through the vertex without intravenous contrast. COMPARISON:  02/20/2012 FINDINGS: Brain: No acute infarct or hemorrhage. Lateral ventricles and midline structures are unremarkable. No acute extra-axial fluid collections. No mass effect. Vascular: No  hyperdense vessel or unexpected calcification. Skull: Normal. Negative for fracture or focal lesion. Sinuses/Orbits: No acute finding. Other: None. IMPRESSION: 1. No acute intracranial process. Electronically Signed   By: Sharlet Salina M.D.   On: 10/11/2019 20:30    Procedures Procedures (including critical care time)  Medications Ordered in ED Medications - No data to display  ED Course  I have reviewed the triage vital signs and the nursing notes.  Pertinent labs & imaging results that were available during my care of the patient were reviewed by me and considered in my medical decision making (see chart for details).    MDM Rules/Calculators/A&P                          Joan Beard is 39 year old female with history of blood clots on blood thinners who presents to the ED after mechanical fall.  Unremarkable vitals.  No fever.  Normal neurological exam.  Head CT negative.  Chest x-ray without any signs of pneumothorax or rib fractures.  Had a ready received a right shoulder x-ray at orthopedics before being sent here for further work-up.  X-ray of the shoulder was negative.  Mostly has right shoulder pain.  Suspect a sprain versus contusion.  Given a sling for comfort.  She has already been prescribed tramadol.  Recommend ice and Tylenol as well.  Discharged in ED in good condition.  Recommend follow-up with orthopedic/primary care if continues to have pain.  This chart was dictated using voice recognition software.  Despite best efforts to proofread,  errors can occur which can change the documentation meaning.    Final Clinical Impression(s) / ED Diagnoses Final diagnoses:  Acute pain of right shoulder    Rx / DC Orders ED Discharge Orders    None       Virgina Norfolk, DO 10/11/19 2157

## 2019-10-11 NOTE — ED Triage Notes (Signed)
Pt arrives to ED w/ c/o fall this morning. Pt slipped in bathroom. Pt denies LOC, but does endorse some amnesia to the event. Pt does not know if she hit her head, pt c/o 8/10 headache in the back of her head. No trauma to back of head noted in triage. Pt taking eliquis for recent admission for DVT and PE. Pt also c/o chest wall pain. Pt denies sob.

## 2019-10-22 DIAGNOSIS — I2690 Septic pulmonary embolism without acute cor pulmonale: Secondary | ICD-10-CM | POA: Diagnosis not present

## 2020-03-03 DIAGNOSIS — E1169 Type 2 diabetes mellitus with other specified complication: Secondary | ICD-10-CM | POA: Insufficient documentation

## 2020-06-21 ENCOUNTER — Other Ambulatory Visit: Payer: Self-pay | Admitting: Allergy

## 2020-06-23 NOTE — Telephone Encounter (Signed)
Patient called requesting a refill for famotidine 40 mg. Pharmacy [facility]. Last seen 07/2019. Patient made an appointment for 07/01/20.

## 2020-06-23 NOTE — Telephone Encounter (Signed)
Refill sent to [facility]

## 2020-07-01 ENCOUNTER — Ambulatory Visit: Payer: BC Managed Care – PPO | Admitting: Allergy

## 2020-10-19 IMAGING — CT CT HEAD W/O CM
3 series · 16 of 47 positions shown, 19 images · non-contrast
Comparison: 02/20/2012

CLINICAL DATA: Slipped and fell, hit back of head, anticoagulated

EXAM:
CT HEAD WITHOUT CONTRAST
TECHNIQUE: Contiguous axial images were obtained from the base of the skull
through the vertex without intravenous contrast.

[Series 3: head 5.0 h30s · axial · 0.46mm/px · z∈[+1144,+1279]mm · 10 of 33 slices shown, 13 images]
[im 3/33  brain]
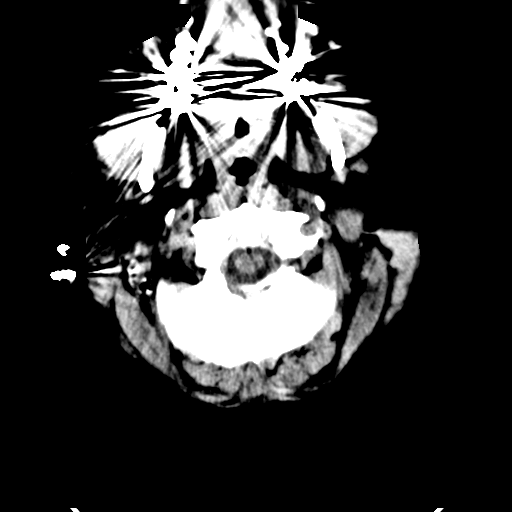
[im 3/33  bone]
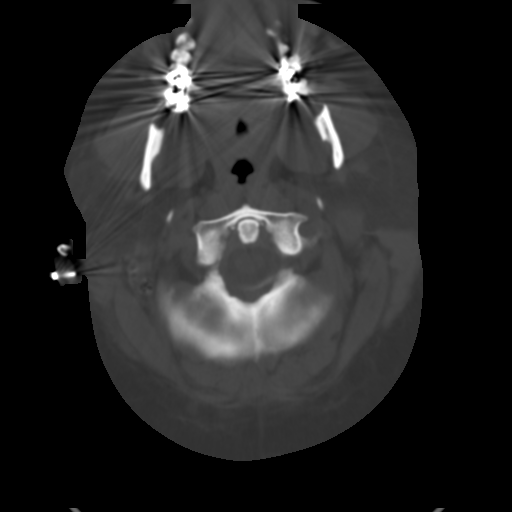
[im 6/33  brain]
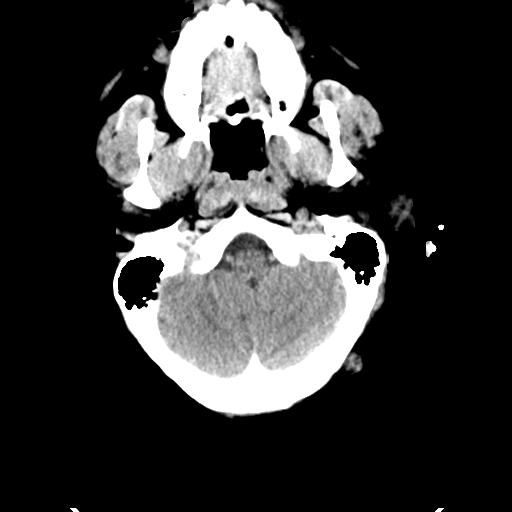
[im 9/33  brain]
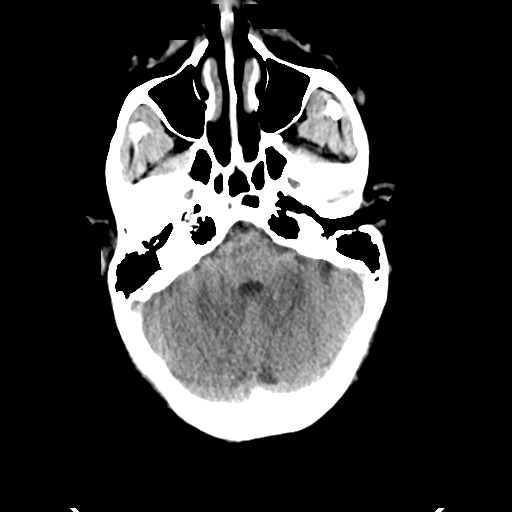
[im 12/33  brain]
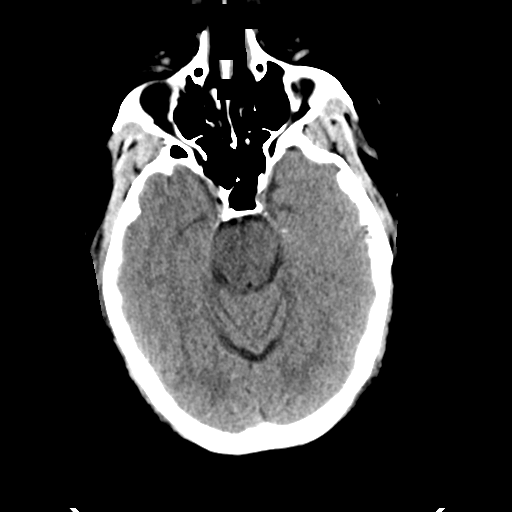
[im 15/33  brain]
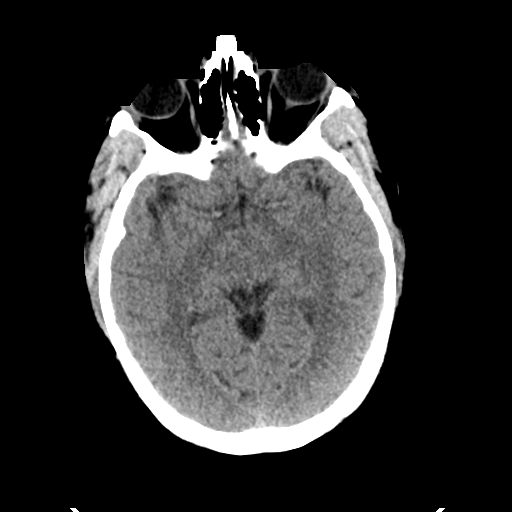
[im 15/33  bone]
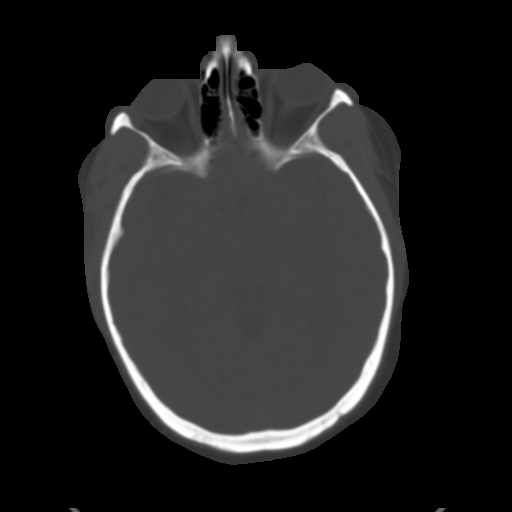
[im 18/33  brain]
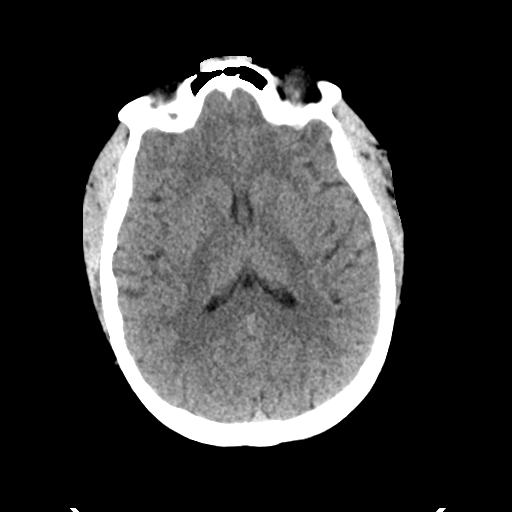
[im 21/33  brain]
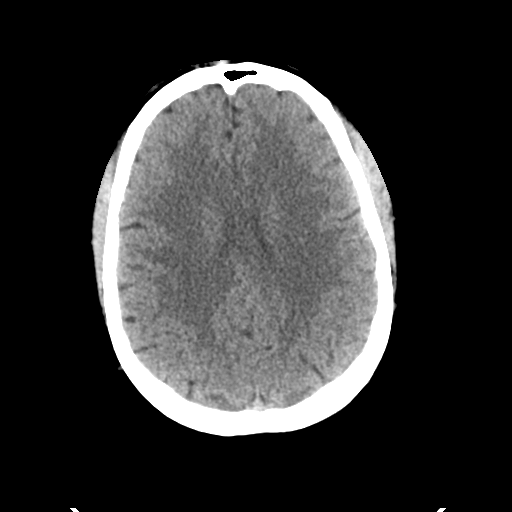
[im 25/33  brain]
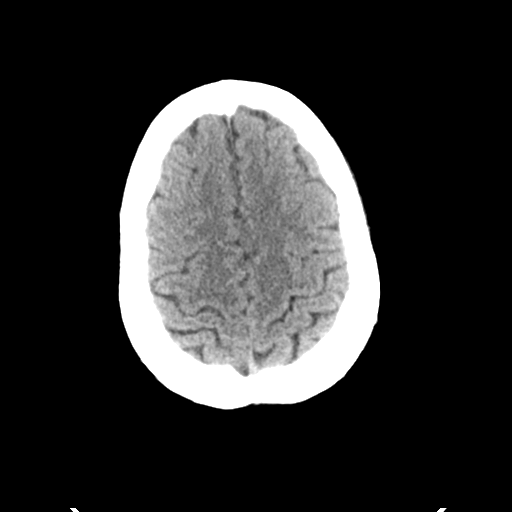
[im 27/33  brain]
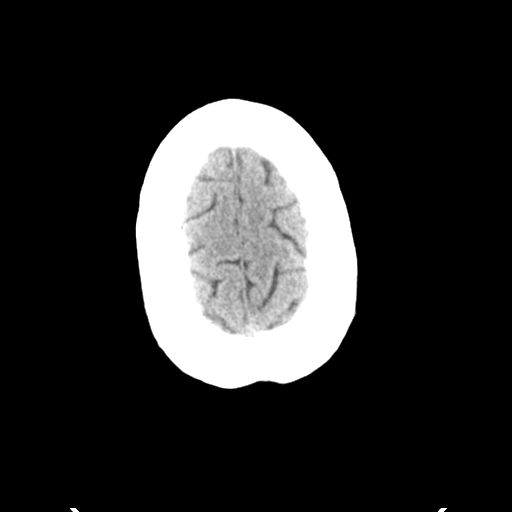
[im 27/33  bone]
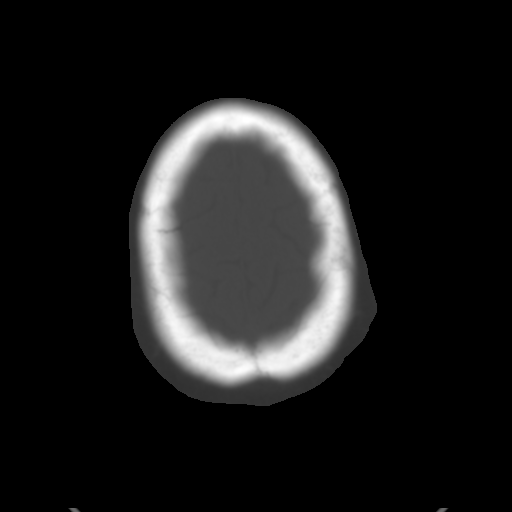
[im 30/33  brain]
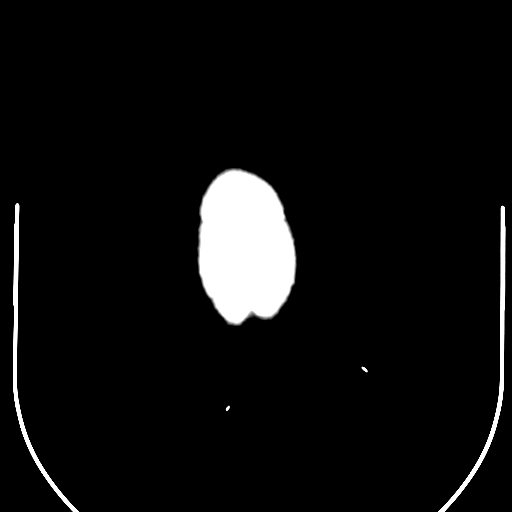

[Series 5: head 3.0 mpr cor · coronal · 0.31mm/px · 3 of 72 slices shown]
[im 24/72  brain]
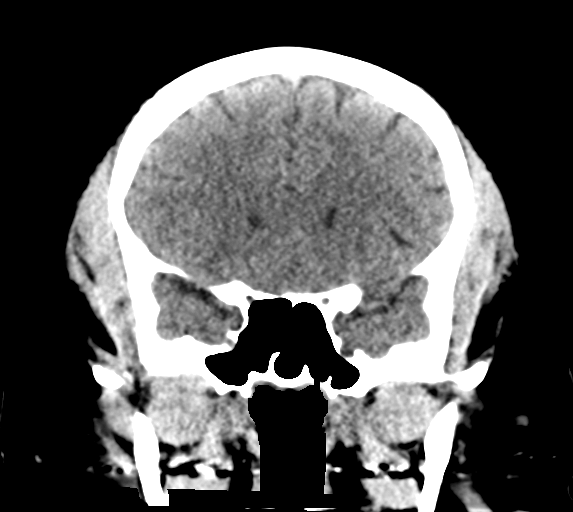
[im 32/72  brain]
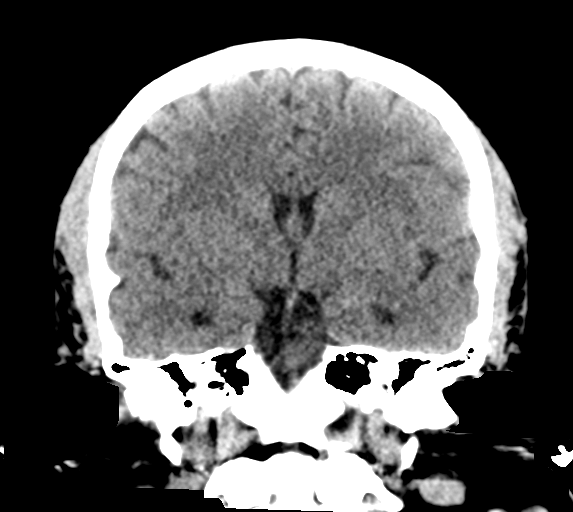
[im 40/72  brain]
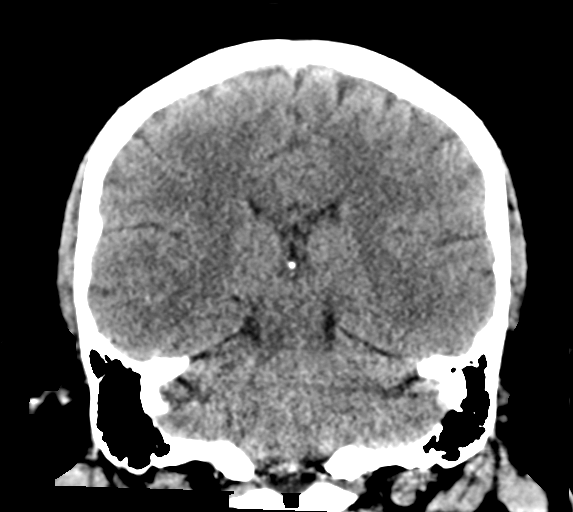

[Series 6: head 3.0 mpr sag · sagittal · 0.31mm/px · 3 of 64 slices shown]
[im 22/64  brain]
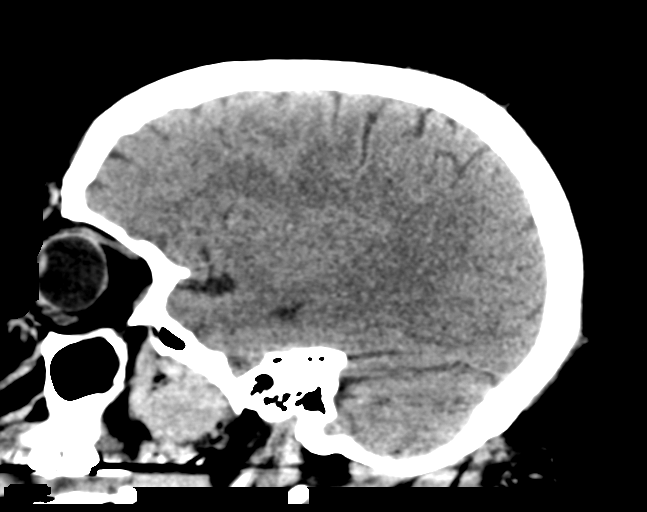
[im 32/64  brain]
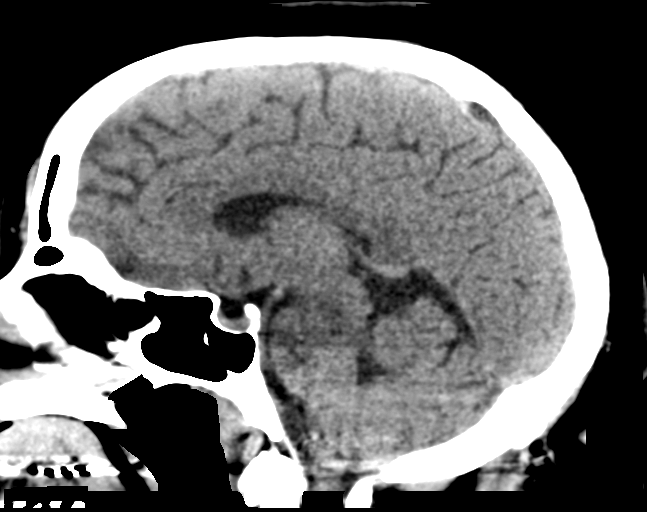
[im 43/64  brain]
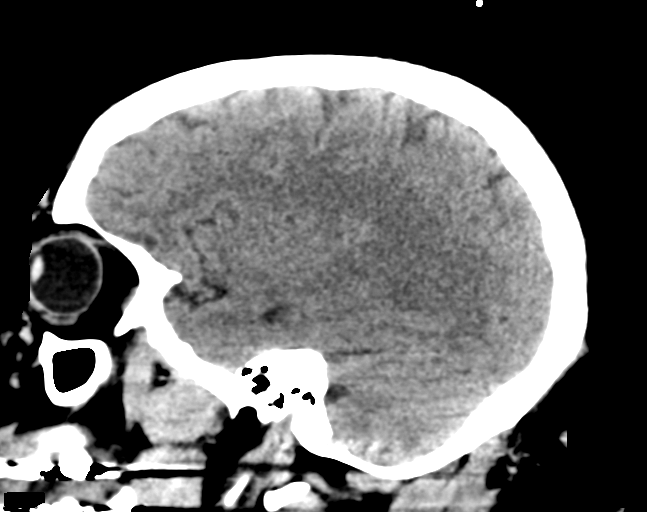

[16 of 47 positions shown; findings below may reference images not displayed]

FINDINGS: Brain: No acute infarct or hemorrhage. Lateral ventricles and
midline structures are unremarkable. No acute extra-axial fluid
collections. No mass effect.

Vascular: No hyperdense vessel or unexpected calcification.

Skull: Normal. Negative for fracture or focal lesion.

Sinuses/Orbits: No acute finding.

Other: None.
IMPRESSION: 1. No acute intracranial process.

## 2020-12-19 ENCOUNTER — Other Ambulatory Visit: Payer: Self-pay | Admitting: Allergy

## 2021-03-20 ENCOUNTER — Telehealth: Payer: Self-pay | Admitting: Oncology

## 2021-03-20 NOTE — Telephone Encounter (Signed)
Scheduled appt per 12/21 referral. Pt is aware of appt date and time.  °

## 2021-04-07 ENCOUNTER — Other Ambulatory Visit: Payer: Self-pay

## 2021-04-07 ENCOUNTER — Inpatient Hospital Stay: Payer: BC Managed Care – PPO

## 2021-04-07 ENCOUNTER — Inpatient Hospital Stay: Payer: BC Managed Care – PPO | Attending: Oncology | Admitting: Oncology

## 2021-04-07 DIAGNOSIS — E282 Polycystic ovarian syndrome: Secondary | ICD-10-CM | POA: Insufficient documentation

## 2021-04-07 DIAGNOSIS — R768 Other specified abnormal immunological findings in serum: Secondary | ICD-10-CM

## 2021-04-08 DIAGNOSIS — R768 Other specified abnormal immunological findings in serum: Secondary | ICD-10-CM | POA: Insufficient documentation

## 2021-04-08 NOTE — Progress Notes (Signed)
[facility]  [address] [place],  [place]  [address] [phone]  Clinic Day:  04/07/2021  Referring physician: Garnetta Buddy I, NP   HISTORY OF PRESENT ILLNESS:  The patient is a 41 y.o. female who I was asked to consult upon with respect to having false positive RPR tests for syphilis.  As this is happened on 2 separate occasions, the concern was raised as to whether there may be some type of hematologic or oncologic connection to these findings.  Of note, this patient had a right lower extremity DVT that manifested into bilateral pulmonary emboli back in 2021.  At that time, a hypercoagulable work-up was done, which included checking for antiphospholipid syndrome, Antithrombin deficiency, protein C deficiency, protein S deficiency, factor V Leiden mutation, and prothrombin gene mutation.  All of these tests came back negative for any potential clotting disorder being present.  Included within her antiphospholipid syndrome testing was a lupus anticoagulant screen, which came back negative.  The patient claims to be taking Xarelto on a daily basis for anticoagulation.  As it pertains to there potentially being some type of hematologic/oncologic association with her false positive RPR tests, the patient has had weight loss, but she has been working on her weight reduction.  She denies having any symptoms or findings which concern her for an occult oncologic process being present.    PAST MEDICAL HISTORY:   Past Medical History:  Diagnosis Date   Deviated septum    DVT (deep vein thrombosis) in pregnancy    Dyspnea    Gout    High blood pressure    PCOS (polycystic ovarian syndrome)    Pulmonary emboli (HCC)    Renal artery stenosis (HCC)    Sleep apnea     PAST SURGICAL HISTORY:   Past Surgical History:  Procedure Laterality Date   WISDOM TOOTH EXTRACTION      CURRENT MEDICATIONS:   Current Outpatient Medications  Medication Sig Dispense  Refill   Dulaglutide (TRULICITY) 1.5 0000000 SOPN Inject into the skin.     rivaroxaban (XARELTO) 10 MG TABS tablet Take 10 mg by mouth daily.     VALACYCLOVIR HCL PO Take by mouth.     allopurinol (ZYLOPRIM) 100 MG tablet Take 100 mg by mouth daily.     famotidine (PEPCID) 40 MG tablet TAKE ONE TABLET BY MOUTH AT BEDTIME 30 tablet 5   labetalol (NORMODYNE) 100 MG tablet Take 1 tablet (100 mg total) by mouth 2 (two) times daily. (Patient taking differently: Take 100 mg by mouth 3 (three) times daily. ) 30 tablet 0   montelukast (SINGULAIR) 10 MG tablet Take 1 tablet (10 mg total) by mouth at bedtime. 30 tablet 5   spironolactone (ALDACTONE) 25 MG tablet Take 25 mg by mouth 2 (two) times daily.      No current facility-administered medications for this visit.    ALLERGIES:   Allergies  Allergen Reactions   Metformin     Other reaction(s): Confusion (intolerance), felt lethargic   Nickel Itching, Rash and Swelling    Other reaction(s): lessions   Metronidazole Hives   Dilaudid [Hydromorphone Hcl] Nausea And Vomiting   Latex Rash    FAMILY HISTORY:   Family History  Problem Relation Age of Onset   Stroke Father    Diabetes Maternal Grandmother    High blood pressure Maternal Grandmother    High blood pressure Paternal Grandmother    Alzheimer's disease Paternal Grandmother    SOCIAL  HISTORY:  The patient was born and raised in [place], [place].  She currently lives in town.  She is single, with no children.  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  There is no history of smoking.  She drinks alcohol on rare occasions.  REVIEW OF SYSTEMS:  Review of Systems  Constitutional:  Negative for fatigue and fever.  HENT:   Negative for hearing loss and sore throat.   Eyes:  Negative for eye problems.  Respiratory:  Negative for chest tightness, cough and hemoptysis.   Cardiovascular:  Negative for chest pain and palpitations.  Gastrointestinal:  Negative for abdominal distention,  abdominal pain, blood in stool, constipation, diarrhea, nausea and vomiting.  Endocrine: Negative for hot flashes.  Genitourinary:  Negative for difficulty urinating, dysuria, frequency, hematuria and nocturia.   Musculoskeletal:  Negative for arthralgias, back pain, gait problem and myalgias.  Skin: Negative.  Negative for itching and rash.  Neurological: Negative.  Negative for dizziness, extremity weakness, gait problem, headaches, light-headedness and numbness.  Hematological: Negative.   Psychiatric/Behavioral: Negative.  Negative for depression and suicidal ideas. The patient is not nervous/anxious.     PHYSICAL EXAM:  Blood pressure (!) 184/104, pulse 87, temperature 99.1 F (37.3 C), resp. rate 16, height 5' 3.5" (1.613 m), weight (!) 341 lb 9.6 oz (154.9 kg), SpO2 95 %. Wt Readings from Last 3 Encounters:  04/07/21 (!) 341 lb 9.6 oz (154.9 kg)  09/15/19 (!) 369 lb 11.2 oz (167.7 kg)  05/10/19 (!) 379 lb (171.9 kg)   Body mass index is 59.56 kg/m. Performance status (ECOG): 0 - Asymptomatic Physical Exam Constitutional:      Appearance: Normal appearance. She is not ill-appearing.  HENT:     Mouth/Throat:     Mouth: Mucous membranes are moist.     Pharynx: Oropharynx is clear. No oropharyngeal exudate or posterior oropharyngeal erythema.  Cardiovascular:     Rate and Rhythm: Normal rate and regular rhythm.     Heart sounds: No murmur heard.   No friction rub. No gallop.  Pulmonary:     Effort: Pulmonary effort is normal. No respiratory distress.     Breath sounds: Normal breath sounds. No wheezing, rhonchi or rales.  Abdominal:     General: Bowel sounds are normal. There is no distension.     Palpations: Abdomen is soft. There is no mass.     Tenderness: There is no abdominal tenderness.  Musculoskeletal:        General: No swelling.     Right lower leg: No edema.     Left lower leg: No edema.  Lymphadenopathy:     Cervical: No cervical adenopathy.     Upper Body:      Right upper body: No supraclavicular or axillary adenopathy.     Left upper body: No supraclavicular or axillary adenopathy.     Lower Body: No right inguinal adenopathy. No left inguinal adenopathy.  Skin:    General: Skin is warm.     Coloration: Skin is not jaundiced.     Findings: No lesion or rash.  Neurological:     General: No focal deficit present.     Mental Status: She is alert and oriented to person, place, and time. Mental status is at baseline.  Psychiatric:        Mood and Affect: Mood normal.        Behavior: Behavior normal.        Thought Content: Thought content normal.   ASSESSMENT &  PLAN:  A 41 y.o. female who I was asked to consult upon as to whether there is some type of hematologic/oncologic connection with 2 previous false positive RPR tests.  In clinic today, I reminded the patient that her entire hypercoagulable work-up in 2021 came back negative.  Included within this was testing negative for antiphospholipid syndrome, which includes checking for a lupus anticoagulant screen.  False RPR tests can be seen an underlying rheumatologic disorder such as lupus, which can seldomly lead to a lupus anticoagulant screen coming back positive (even though there is no direct connection between this test and that diagnosis).  This test came back negative.  Furthermore, this patient has never had any symptoms or findings to suggest any type of underlying rheumatologic process is present.  A false positive RPR test can also be seen with certain hematologic conditions such as autoimmune hemolytic anemia or ITP.  The patient has never had labs showing either of diagnoses to be present.  False positive RPR tests can be seen in the setting of an underlying malignancy.  There is nothing per her history or physical exam which suggests any type of malignant process is present.  The patient did mention how she has had an elevated prolactin level in the recent past.  Ultimately, she may need  to be checked for a prolactinoma by her endocrinologist.  However, I do not get the sense that any type of obvious hematologic or oncologic process is present that could have triggered her false positive RPR test in the recent past.  She knows it remains important for her to continue taking anticoagulation indefinitely due to her bilateral pulmonary emboli.  Otherwise, she has no other pressing hematologic or oncologic issues, her care will be turned back over to her other physicians.,  The patient understands all the plans discussed today and is in agreement with them.  I do appreciate Garnetta Buddy I, NP for his new consult.   Zaelyn Noack Macarthur Critchley, MD

## 2022-01-06 ENCOUNTER — Ambulatory Visit (INDEPENDENT_AMBULATORY_CARE_PROVIDER_SITE_OTHER): Payer: BC Managed Care – PPO | Admitting: Allergy

## 2022-01-06 ENCOUNTER — Encounter: Payer: Self-pay | Admitting: Allergy

## 2022-01-06 VITALS — BP 130/72 | HR 101 | Temp 98.7°F | Resp 18 | Ht 63.5 in | Wt 370.0 lb

## 2022-01-06 DIAGNOSIS — J310 Chronic rhinitis: Secondary | ICD-10-CM | POA: Diagnosis not present

## 2022-01-06 DIAGNOSIS — H1013 Acute atopic conjunctivitis, bilateral: Secondary | ICD-10-CM | POA: Diagnosis not present

## 2022-01-06 DIAGNOSIS — K219 Gastro-esophageal reflux disease without esophagitis: Secondary | ICD-10-CM

## 2022-01-06 DIAGNOSIS — H109 Unspecified conjunctivitis: Secondary | ICD-10-CM

## 2022-01-06 MED ORDER — FAMOTIDINE 40 MG PO TABS: 40.0000 mg | ORAL_TABLET | Freq: Every day | ORAL | 5 refills | Status: AC

## 2022-01-06 MED ORDER — MONTELUKAST SODIUM 10 MG PO TABS: 10.0000 mg | ORAL_TABLET | Freq: Every day | ORAL | 5 refills | Status: AC

## 2022-01-06 MED ORDER — ESOMEPRAZOLE MAGNESIUM 40 MG PO PACK: 40.0000 mg | PACK | Freq: Every day | ORAL | 0 refills | Status: AC

## 2022-01-06 NOTE — Progress Notes (Signed)
Follow-up Note  RE: Joan Beard MRN: [id number] DOB: Jul 12, 1980 Date of Office Visit: 01/06/2022   History of present illness: Joan Beard is a 41 y.o. female presenting today for follow-up of rhinitis and LPRD.  She was last seen in the office on 07/24/19 by myself.  She returns today as she states her allergy symptoms and reflux are much worse now.   With her allergy symptoms she nasal congestion, drainage, sneezing, itching.  The symptoms are just out of control at this time.  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  She is also wondering if she has any allergies at this point as previous testing was negative. She feels like she has to be allergic to something.   She states previously she tried one nose spray and it gave her headaches.  A different nose spray she tried made her nose hurt.  She even states doing nasal saline rinses is a big struggle that usually turns into her not doing it.  She really does not want to use any nasal regimen.  She has been taking benadryl which helps her symptoms but makes her sleepy.  She has been taking generic cetirizine nightly.  She also has been taking singulair daily. She has in the past tried Human resources officer.  With her reflux she states it is a lot worse and she can feel the acid bubble into her throat and chest.  Its worse with laying down.  She states the famotidine use to be enough to control the reflux but not any longer.  She has not tried a PPI at t his time.   She has upper and lower endoscopy scheduled for December.  She states one of her doctors took her off the periactin she was taking before for migraine control.    Review of systems: Review of Systems  Constitutional: Negative.   HENT: Negative.         See HPI  Eyes: Negative.   Respiratory: Negative.    Cardiovascular: Negative.   Gastrointestinal:        See HPI  Musculoskeletal: Negative.   Skin: Negative.   Allergic/Immunologic: Negative.    Neurological: Negative.      All other systems negative unless noted above in HPI  Past medical/social/surgical/family history have been reviewed and are unchanged unless specifically indicated below.  No changes  Medication List: Current Outpatient Medications  Medication Sig Dispense Refill   allopurinol (ZYLOPRIM) 100 MG tablet Take 100 mg by mouth daily.     Dulaglutide (TRULICITY) 1.5 QA/8.3MH SOPN Inject into the skin.     famotidine (PEPCID) 40 MG tablet TAKE ONE TABLET BY MOUTH AT BEDTIME 30 tablet 5   labetalol (NORMODYNE) 100 MG tablet Take 1 tablet (100 mg total) by mouth 2 (two) times daily. (Patient taking differently: Take 100 mg by mouth 3 (three) times daily.) 30 tablet 0   montelukast (SINGULAIR) 10 MG tablet Take 1 tablet (10 mg total) by mouth at bedtime. 30 tablet 5   rivaroxaban (XARELTO) 10 MG TABS tablet Take 10 mg by mouth daily.     spironolactone (ALDACTONE) 25 MG tablet Take 25 mg by mouth 2 (two) times daily.      VALACYCLOVIR HCL PO Take by mouth.     No current facility-administered medications for this visit.     Known medication allergies: Allergies  Allergen Reactions   Metformin     Other reaction(s): Confusion (intolerance), felt lethargic  Nickel Itching, Rash and Swelling    Other reaction(s): lessions   Metronidazole Hives   Dilaudid [Hydromorphone Hcl] Nausea And Vomiting   Latex Rash     Physical examination: Blood pressure 130/72, pulse (!) 101, temperature 98.7 F (37.1 C), temperature source Temporal, resp. rate 18, height 5' 3.5" (1.613 m), weight (!) 370 lb (167.8 kg), SpO2 97 %.  General: Alert, interactive, in no acute distress. HEENT: PERRLA, TMs pearly gray, turbinates moderately edematous without discharge, post-pharynx non erythematous. Neck: Supple without lymphadenopathy. Lungs: Clear to auscultation without wheezing, rhonchi or rales. {no increased work of breathing. CV: Normal S1, S2 without murmurs. Abdomen:  Nondistended, nontender. Skin: Warm and dry, without lesions or rashes. Extremities:  No clubbing, cyanosis or edema. Neuro:   Grossly intact.  Diagnositics/Labs: None today  Assessment and plan: Rhinoconjunctivitis- nonallergic based on previous testing but will obtain environmental allergy panel by bloodwork to help determine if he has developed allergens since last viist.  Will avoid nasal regimen at this time. If testing is positive then would recommend immunotherapy LPRD- worsening symptoms despite H2 blocker thus will add in PPI at this time.  She has upcoming endoscopy planned for December  1.  Treat and prevent inflammation:   A.  Continue  Singulair 10mg  daily at bedtime.    B.  Start taking either Xyzal or Allegra daily.  Samples provided.  These are long-acting antihistamines to replace benadryl use   2.  Treat and prevent reflux:   A. Continue Famotidine 40 mg - 1 tablet 1 time per day   B. Start trial of PPI (common options would be Nexium, Prilosec) for 6-8 weeks  C. Continue avoidance of trigger foods (ie. Acidic foods)   3.  Will obtain environmental allergy panel to see if you do have allergies that may benefit from immunotherapy  Follow-up in 3-4 months or sooner if needed  I appreciate the opportunity to take part in [facility] care. Please do not hesitate to contact me with questions.  Sincerely,   Prudy Feeler, MD Allergy/Immunology [facility]

## 2022-01-06 NOTE — Patient Instructions (Addendum)
  1.  Treat and prevent inflammation:   A.  Continue  Singulair 10mg  daily at bedtime.    B.  Start taking either Xyzal or Allegra daily.  Samples provided.  These are long-acting antihistamines to replace benadryl use   2.  Treat and prevent reflux:   A. Continue Famotidine 40 mg - 1 tablet 1 time per day   B. Start trial of PPI (common options would be Nexium, Prilosec) for 6-8 weeks  C. Continue avoidance of trigger foods (ie. Acidic foods)   3.  Will obtain environmental allergy panel to see if you do have allergies that may benefit from immunotherapy  Follow-up in 3-4 months or sooner if needed

## 2022-01-08 LAB — ALLERGENS W/TOTAL IGE AREA 2

## 2022-05-13 ENCOUNTER — Ambulatory Visit: Payer: BC Managed Care – PPO | Admitting: Allergy

## 2022-09-13 ENCOUNTER — Other Ambulatory Visit: Payer: Self-pay | Admitting: Allergy

## 2024-01-02 ENCOUNTER — Encounter (HOSPITAL_BASED_OUTPATIENT_CLINIC_OR_DEPARTMENT_OTHER): Payer: Self-pay | Admitting: Emergency Medicine

## 2024-01-02 ENCOUNTER — Ambulatory Visit (HOSPITAL_BASED_OUTPATIENT_CLINIC_OR_DEPARTMENT_OTHER): Admission: EM | Admit: 2024-01-02 | Discharge: 2024-01-02 | Disposition: A | Discharge status: Home / Self Care

## 2024-01-02 DIAGNOSIS — R1085 Abdominal pain of multiple sites: Secondary | ICD-10-CM

## 2024-01-02 DIAGNOSIS — M545 Low back pain, unspecified: Secondary | ICD-10-CM | POA: Diagnosis not present

## 2024-01-02 NOTE — ED Provider Notes (Signed)
 [facility]    CSN: [id number] Arrival date & time: 01/02/24  1830      History   Chief Complaint Chief Complaint  Patient presents with   Abdominal Pain    HPI Joan Beard is a 43 y.o. female.   43 year old female who came in with back pain and abdominal pain.  She reports she has had abdominal pain since 12/30/2023.  The pain got severe, 10 out of 10 pain level, at 3 PM today.  She arrived at our urgent care approximately 6:30 PM.  At 7:02 PM, she slumped from a chair onto the floor.  The patient access team member did not hear any kind of noises of her falling but looked up and saw her laying on the floor.  Initially she said she was hurting so bad she could not move and although she was crying she would not answer questions.     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   LVH (left ventricular hypertrophy) 09/25/2019   History of DVT of lower extremity 09/16/2019   PE (pulmonary thromboembolism) (HCC) 09/15/2019   Pulmonary embolism (HCC) 09/14/2019   HTN (hypertension) 09/14/2019   OSA (obstructive sleep apnea) 09/14/2019   Renal artery stenosis 09/14/2019   Morbid obesity with BMI of 60.0-69.9, adult (HCC) 09/14/2019   Gastroesophageal reflux disease without esophagitis 08/30/2019   Polycystic ovarian syndrome 05/03/2019    Past Surgical History:  Procedure Laterality Date   APPENDECTOMY     WISDOM TOOTH EXTRACTION      OB History   No obstetric history on file.      Home Medications    Prior to Admission medications   Medication Sig Start Date End  Date Taking? Authorizing Provider  allopurinol  (ZYLOPRIM ) 100 MG tablet Take 100 mg by mouth daily. 04/04/19   [provider]  Dulaglutide (TRULICITY) 1.5 MG/0.5ML SOPN Inject into the skin.    [provider]  esomeprazole  (NEXIUM ) 40 MG packet Take 40 mg by mouth daily before breakfast. For 8 weeks 01/06/22   Jeneal Danita Macintosh, MD  famotidine  (PEPCID ) 40 MG tablet Take 1 tablet (40 mg total) by mouth at bedtime. 01/06/22   Jeneal Danita Macintosh, MD  labetalol  (NORMODYNE ) 100 MG tablet Take 1 tablet (100 mg total) by mouth 2 (two) times daily. Patient taking differently: Take 100 mg by mouth 3 (three) times daily. 09/17/19   Matcha, Anupama, MD  montelukast  (SINGULAIR ) 10 MG tablet Take 1 tablet (10 mg total) by mouth at bedtime. 01/06/22   Jeneal Danita Macintosh, MD  rivaroxaban (XARELTO) 10 MG TABS tablet Take 10 mg by mouth daily.    [provider]  spironolactone  (ALDACTONE ) 25 MG tablet Take 25 mg by mouth 2 (two) times daily.  05/09/19   [provider]  VALACYCLOVIR HCL PO Take by mouth.    [provider]    Family History Family History  Problem Relation Age of Onset   Stroke Father    Diabetes Maternal Grandmother  High blood pressure Maternal Grandmother    High blood pressure Paternal Grandmother    Alzheimer's disease Paternal Grandmother     Social History Social History   Tobacco Use   Smoking status: Former   Smokeless tobacco: Never   Tobacco comments:    smoked for less than one year in her teenage years  Substance Use Topics   Alcohol use: Yes   Drug use: Never     Allergies   Metformin, Nickel, Metronidazole, Dilaudid  [hydromorphone  hcl], and Latex   Review of Systems Review of Systems  Constitutional:  Negative for chills and fever.  HENT:  Negative for ear pain and sore throat.   Eyes:  Negative for pain and visual disturbance.  Respiratory:  Negative for cough and shortness of breath.    Cardiovascular:  Negative for chest pain and palpitations.  Gastrointestinal:  Positive for abdominal pain. Negative for constipation, diarrhea, nausea and vomiting.  Genitourinary:  Negative for dysuria and hematuria.  Musculoskeletal:  Positive for back pain. Negative for arthralgias.  Skin:  Negative for color change and rash.  Neurological:  Negative for seizures and syncope.  All other systems reviewed and are negative.    Physical Exam Triage Vital Signs ED Triage Vitals  Encounter Vitals Group     BP      Girls Systolic BP Percentile      Girls Diastolic BP Percentile      Boys Systolic BP Percentile      Boys Diastolic BP Percentile      Pulse      Resp      Temp      Temp src      SpO2      Weight      Height      Head Circumference      Peak Flow      Pain Score      Pain Loc      Pain Education      Exclude from Growth Chart    No data found.  Updated Vital Signs BP (!) 163/139 (BP Location: Right Arm)   Pulse 75   Temp 98.3 F (36.8 C) (Oral)   Resp 20   LMP 12/22/2023 (Approximate)   SpO2 96%   Visual Acuity Right Eye Distance:   Left Eye Distance:   Bilateral Distance:    Right Eye Near:   Left Eye Near:    Bilateral Near:     Physical Exam Vitals and nursing note reviewed.  Constitutional:      General: She is in acute distress.     Appearance: She is well-developed. She is ill-appearing. She is not toxic-appearing or diaphoretic.  HENT:     Head: Normocephalic and atraumatic.     Right Ear: Hearing, tympanic membrane, ear canal and external ear normal.     Left Ear: Hearing, tympanic membrane, ear canal and external ear normal.     Nose: No congestion or rhinorrhea.     Right Sinus: No maxillary sinus tenderness or frontal sinus tenderness.     Left Sinus: No maxillary sinus tenderness or frontal sinus tenderness.     Mouth/Throat:     Lips: Pink.     Mouth: Mucous membranes are moist.     Pharynx: Uvula midline. No oropharyngeal  exudate or posterior oropharyngeal erythema.     Tonsils: No tonsillar exudate.  Eyes:     Conjunctiva/sclera: Conjunctivae normal.     Pupils: Pupils are equal, round, and reactive  to light.  Cardiovascular:     Rate and Rhythm: Normal rate and regular rhythm.     Heart sounds: S1 normal and S2 normal. No murmur heard. Pulmonary:     Effort: Pulmonary effort is normal. No respiratory distress.     Breath sounds: Normal breath sounds. No decreased breath sounds, wheezing, rhonchi or rales.  Abdominal:     General: Bowel sounds are normal.     Palpations: Abdomen is soft.     Tenderness: There is abdominal tenderness (Moderate to severe abdominal pain.) in the right upper quadrant, right lower quadrant, epigastric area, suprapubic area, left upper quadrant and left lower quadrant. There is no right CVA tenderness, left CVA tenderness, guarding or rebound. Negative signs include Murphy's sign, Rovsing's sign and McBurney's sign.  Musculoskeletal:        General: No swelling.     Cervical back: Neck supple.     Lumbar back: Tenderness and bony tenderness present. No swelling, edema, deformity, signs of trauma, lacerations or spasms. Normal range of motion. No scoliosis.  Lymphadenopathy:     Head:     Right side of head: No submental, submandibular, tonsillar, preauricular or posterior auricular adenopathy.     Left side of head: No submental, submandibular, tonsillar, preauricular or posterior auricular adenopathy.     Cervical: No cervical adenopathy.     Right cervical: No superficial cervical adenopathy.    Left cervical: No superficial cervical adenopathy.  Skin:    General: Skin is warm and dry.     Capillary Refill: Capillary refill takes less than 2 seconds.     Findings: No rash.  Neurological:     Mental Status: She is alert and oriented to person, place, and time.  Psychiatric:        Mood and Affect: Mood is anxious. Affect is tearful.     Comments: Patient is not  communicating much at all.  She is crying in severe pain and mumbling or moaning.  She has answered some questions.  She appears to be significantly distressed due to pain.      UC Treatments / Results  Labs (all labs ordered are listed, but only abnormal results are displayed) Labs Reviewed - No data to display  EKG   Radiology No results found.  Procedures Procedures (including critical care time)  Medications Ordered in UC Medications - No data to display  Initial Impression / Assessment and Plan / UC Course  I have reviewed the triage vital signs and the nursing notes.  Pertinent labs & imaging results that were available during my care of the patient were reviewed by me and considered in my medical decision making (see chart for details).  Plan of Care: Abdominal and back pain: EMS called due to patient having such severe pain that she would not communicate and we could not get her off the floor when she slumped to the floor.  EMS will transport her to the hospital for further evaluation.  I reviewed the plan of care with the patient and/or the patient's guardian.  The patient and/or guardian had time to ask questions and acknowledged that the questions were answered.  I provided instruction on symptoms or reasons to return here or to go to an ER, if symptoms/condition did not improve, worsened or if new symptoms occurred.  Final Clinical Impressions(s) / UC Diagnoses   Final diagnoses:  Abdominal pain of multiple sites  Acute bilateral low back pain without sciatica     Discharge  Instructions      Severe abdominal pain and lower back pain: Patient is moaning in pain.  911 contacted when the patient slumped to the floor and was not able to initially get up on her own.  Will transport via 911 to the local emergency room.     ED Prescriptions   None    PDMP not reviewed this encounter.   Ival Domino, FNP 01/02/24 1930

## 2024-01-02 NOTE — Discharge Instructions (Addendum)
 Severe abdominal pain and lower back pain: Patient is moaning in pain.  911 contacted when the patient slumped to the floor and was not able to initially get up on her own.  Will transport via 911 to the local emergency room.

## 2024-01-02 NOTE — ED Triage Notes (Signed)
 This RN was called to lobby by patient access. This RN found patient laying on floor in lobby. Pt c/o generalized abd pains that started Friday. Pain got worse today around 3 pm. Reports that she vomited on way here.

## 2024-01-02 NOTE — ED Notes (Signed)
 Patient is being discharged from the Urgent Care and sent to the Emergency Department via [facility]. Per Lauraine Spalding, NP, patient is in need of higher level of care due to abdominal pain. Patient is aware and verbalizes understanding of plan of care.  Vitals:   01/02/24 1918  BP: (!) 163/139  Pulse: 75  Resp: 20  Temp: 98.3 F (36.8 C)  SpO2: 96%
